# Patient Record
Sex: Male | Born: 1960 | Race: Black or African American | Hispanic: No | Marital: Single | Smoking: Current every day smoker
Health system: Southern US, Community
[De-identification: ages and names within clinical notes are randomized; demographics above are authoritative.]

## PROBLEM LIST (undated history)

## (undated) ENCOUNTER — Ambulatory Visit: Admission: EM | Source: Home / Self Care

## (undated) DIAGNOSIS — R7303 Prediabetes: Secondary | ICD-10-CM

## (undated) DIAGNOSIS — M47816 Spondylosis without myelopathy or radiculopathy, lumbar region: Secondary | ICD-10-CM

## (undated) DIAGNOSIS — F17210 Nicotine dependence, cigarettes, uncomplicated: Secondary | ICD-10-CM

## (undated) DIAGNOSIS — M549 Dorsalgia, unspecified: Secondary | ICD-10-CM

## (undated) DIAGNOSIS — E785 Hyperlipidemia, unspecified: Secondary | ICD-10-CM

## (undated) DIAGNOSIS — F1491 Cocaine use, unspecified, in remission: Secondary | ICD-10-CM

## (undated) DIAGNOSIS — M48061 Spinal stenosis, lumbar region without neurogenic claudication: Secondary | ICD-10-CM

## (undated) DIAGNOSIS — R0600 Dyspnea, unspecified: Secondary | ICD-10-CM

## (undated) HISTORY — PX: HAND SURGERY: SHX662

---

## 2006-07-30 ENCOUNTER — Emergency Department: Payer: Self-pay | Admitting: Emergency Medicine

## 2006-12-27 ENCOUNTER — Emergency Department: Payer: Self-pay | Admitting: Emergency Medicine

## 2007-05-30 ENCOUNTER — Emergency Department: Payer: Self-pay | Admitting: Emergency Medicine

## 2012-01-21 ENCOUNTER — Emergency Department: Payer: Self-pay | Admitting: *Deleted

## 2012-01-21 LAB — ETHANOL: Ethanol %: <0.003 % (ref 0.000–0.080)

## 2012-01-21 LAB — URINALYSIS, COMPLETE
Bacteria: NONE SEEN
Bilirubin,UR: NEGATIVE
Glucose,UR: NEGATIVE mg/dL (ref 0–75)
Ketone: NEGATIVE
Leukocyte Esterase: NEGATIVE
Nitrite: NEGATIVE
Ph: 6 (ref 4.5–8.0)
Protein: NEGATIVE

## 2012-01-21 LAB — CK TOTAL AND CKMB (NOT AT ARMC): CK, Total: 2167 U/L — ABNORMAL HIGH (ref 35–232)

## 2012-01-21 LAB — CBC
HCT: 39.5 % — ABNORMAL LOW (ref 40.0–52.0)
MCV: 84 fL (ref 80–100)
RBC: 4.71 10*6/uL (ref 4.40–5.90)

## 2012-01-21 LAB — COMPREHENSIVE METABOLIC PANEL
Albumin: 3.7 g/dL (ref 3.4–5.0)
Anion Gap: 10 (ref 7–16)
BUN: 17 mg/dL (ref 7–18)
Bilirubin,Total: 0.6 mg/dL (ref 0.2–1.0)
Calcium, Total: 8.5 mg/dL (ref 8.5–10.1)
Chloride: 107 mmol/L (ref 98–107)
Co2: 23 mmol/L (ref 21–32)
Creatinine: 1.07 mg/dL (ref 0.60–1.30)
EGFR (African American): >60
EGFR (Non-African Amer.): >60
Osmolality: 283 (ref 275–301)
Potassium: 3.8 mmol/L (ref 3.5–5.1)
SGOT(AST): 46 U/L — ABNORMAL HIGH (ref 15–37)
Sodium: 140 mmol/L (ref 136–145)

## 2012-01-21 LAB — TROPONIN I: Troponin-I: <0.02 ng/mL

## 2013-06-03 ENCOUNTER — Emergency Department: Payer: Self-pay | Admitting: Emergency Medicine

## 2015-03-25 ENCOUNTER — Emergency Department
Admission: EM | Admit: 2015-03-25 | Discharge: 2015-03-25 | Disposition: A | Discharge status: Home / Self Care | Payer: Self-pay | Attending: Emergency Medicine | Admitting: Emergency Medicine

## 2015-03-25 ENCOUNTER — Encounter: Payer: Self-pay | Admitting: Emergency Medicine

## 2015-03-25 DIAGNOSIS — R21 Rash and other nonspecific skin eruption: Secondary | ICD-10-CM

## 2015-03-25 DIAGNOSIS — L0201 Cutaneous abscess of face: Secondary | ICD-10-CM | POA: Insufficient documentation

## 2015-03-25 DIAGNOSIS — B86 Scabies: Secondary | ICD-10-CM | POA: Insufficient documentation

## 2015-03-25 DIAGNOSIS — Z720 Tobacco use: Secondary | ICD-10-CM | POA: Insufficient documentation

## 2015-03-25 DIAGNOSIS — L0291 Cutaneous abscess, unspecified: Secondary | ICD-10-CM

## 2015-03-25 HISTORY — DX: Dorsalgia, unspecified: M54.9

## 2015-03-25 MED ORDER — PERMETHRIN 5 % EX CREA
TOPICAL_CREAM | Freq: Once | CUTANEOUS | Status: AC
Start: 2015-03-25 — End: 2015-03-25

## 2015-03-25 MED ORDER — HYDROXYZINE HCL 25 MG PO TABS
25.0000 mg | ORAL_TABLET | Freq: Once | ORAL | Status: AC
  Administered 2015-03-25: 25 mg via ORAL

## 2015-03-25 MED ORDER — SULFAMETHOXAZOLE-TRIMETHOPRIM 800-160 MG PO TABS
ORAL_TABLET | ORAL | Status: AC
  Filled 2015-03-25: qty 1

## 2015-03-25 MED ORDER — HYDROXYZINE HCL 25 MG PO TABS: 25.0000 mg | ORAL_TABLET | Freq: Three times a day (TID) | ORAL | Status: DC | PRN

## 2015-03-25 MED ORDER — SULFAMETHOXAZOLE-TRIMETHOPRIM 400-80 MG PO TABS: 1.0000 | ORAL_TABLET | Freq: Two times a day (BID) | ORAL | Status: DC

## 2015-03-25 MED ORDER — HYDROXYZINE HCL 25 MG PO TABS
ORAL_TABLET | ORAL | Status: AC
  Filled 2015-03-25: qty 1

## 2015-03-25 MED ORDER — SULFAMETHOXAZOLE-TRIMETHOPRIM 800-160 MG PO TABS
1.0000 | ORAL_TABLET | Freq: Once | ORAL | Status: AC
  Administered 2015-03-25: 1 via ORAL

## 2015-03-25 NOTE — Discharge Instructions (Signed)
Take medication as prescribed. Keep clean with soap and water. Apply warm compresses 4 times a day as discussed. Wash all clothes and bedding in hot water.   Follow up with your primary care physician or the above as needed.   Return to ER for new or worsening concerns.   Abscess An abscess is an infected area that contains a collection of pus and debris.It can occur in almost any part of the body. An abscess is also known as a furuncle or boil. CAUSES  An abscess occurs when tissue gets infected. This can occur from blockage of oil or sweat glands, infection of hair follicles, or a minor injury to the skin. As the body tries to fight the infection, pus collects in the area and creates pressure under the skin. This pressure causes pain. People with weakened immune systems have difficulty fighting infections and get certain abscesses more often.  SYMPTOMS Usually an abscess develops on the skin and becomes a painful mass that is red, warm, and tender. If the abscess forms under the skin, you may feel a moveable soft area under the skin. Some abscesses break open (rupture) on their own, but most will continue to get worse without care. The infection can spread deeper into the body and eventually into the bloodstream, causing you to feel ill.  DIAGNOSIS  Your caregiver will take your medical history and perform a physical exam. A sample of fluid may also be taken from the abscess to determine what is causing your infection. TREATMENT  Your caregiver may prescribe antibiotic medicines to fight the infection. However, taking antibiotics alone usually does not cure an abscess. Your caregiver may need to make a small cut (incision) in the abscess to drain the pus. In some cases, gauze is packed into the abscess to reduce pain and to continue draining the area. HOME CARE INSTRUCTIONS   Only take over-the-counter or prescription medicines for pain, discomfort, or fever as directed by your caregiver.  If  you were prescribed antibiotics, take them as directed. Finish them even if you start to feel better.  If gauze is used, follow your caregiver's directions for changing the gauze.  To avoid spreading the infection:  Keep your draining abscess covered with a bandage.  Wash your hands well.  Do not share personal care items, towels, or whirlpools with others.  Avoid skin contact with others.  Keep your skin and clothes clean around the abscess.  Keep all follow-up appointments as directed by your caregiver. SEEK MEDICAL CARE IF:   You have increased pain, swelling, redness, fluid drainage, or bleeding.  You have muscle aches, chills, or a general ill feeling.  You have a fever. MAKE SURE YOU:   Understand these instructions.  Will watch your condition.  Will get help right away if you are not doing well or get worse. Document Released: 07/08/2005 Document Revised: 03/29/2012 Document Reviewed: 12/11/2011 [facility] Patient Information 2015 [place], [place]. This information is not intended to replace advice given to you by your health care provider. Make sure you discuss any questions you have with your health care provider.  Scabies Scabies are small bugs (mites) that burrow under the skin and cause red bumps and severe itching. These bugs can only be seen with a microscope. Scabies are highly contagious. They can spread easily from person to person by direct contact. They are also spread through sharing clothing or linens that have the scabies mites living in them. It is not unusual for an entire  family to become infected through shared towels, clothing, or bedding.  HOME CARE INSTRUCTIONS   Your caregiver may prescribe a cream or lotion to kill the mites. If cream is prescribed, massage the cream into the entire body from the neck to the bottom of both feet. Also massage the cream into the scalp and face if your child is less than 39 year old. Avoid the eyes and mouth. Do not wash  your hands after application.  Leave the cream on for 8 to 12 hours. Your child should bathe or shower after the 8 to 12 hour application period. Sometimes it is helpful to apply the cream to your child right before bedtime.  One treatment is usually effective and will eliminate approximately 95% of infestations. For severe cases, your caregiver may decide to repeat the treatment in 1 week. Everyone in your household should be treated with one application of the cream.  New rashes or burrows should not appear within 24 to 48 hours after successful treatment. However, the itching and rash may last for 2 to 4 weeks after successful treatment. Your caregiver may prescribe a medicine to help with the itching or to help the rash go away more quickly.  Scabies can live on clothing or linens for up to 3 days. All of your child's recently used clothing, towels, stuffed toys, and bed linens should be washed in hot water and then dried in a dryer for at least 20 minutes on high heat. Items that cannot be washed should be enclosed in a plastic bag for at least 3 days.  To help relieve itching, bathe your child in a cool bath or apply cool washcloths to the affected areas.  Your child may return to school after treatment with the prescribed cream. SEEK MEDICAL CARE IF:   The itching persists longer than 4 weeks after treatment.  The rash spreads or becomes infected. Signs of infection include red blisters or yellow-tan crust. Document Released: 09/28/2005 Document Revised: 12/21/2011 Document Reviewed: 02/06/2009 [facility] Patient Information 2015 [place], [place]. This information is not intended to replace advice given to you by your health care provider. Make sure you discuss any questions you have with your health care provider.

## 2015-03-25 NOTE — ED Notes (Signed)
Pt has an abscess on his forehead on the left side that started 3-4 days ago . pty state that he put warm compress on it and it oozed pus and blood. Pt also states that he has a rash on both his forearms that itch. He says the rash on the forearm has be there for 2-3 months. Airway intact breathing and circulation within normal limits. No apparent distress. Pt states he feels no pain but has some pressure on the site of the abscess.

## 2015-03-25 NOTE — ED Provider Notes (Signed)
[facility] Emergency Department Provider Note  ____________________________________________  Time seen: Approximately 7:17 AM  I have reviewed the triage vital signs and the nursing notes.   HISTORY  Chief Complaint Abscess   HPI Johnny Gonzales is a 54 y.o. male presents to the ER for complaints of abscess to left forehead. Patient states that he thinks that he was bitten by an insect several days ago and it looked like he has small pimple which gradually progressed. States the area is mildly tender but denies pain. Denies itching or other pain. Denies other abscesses. Patient states it has been draining small amounts intermittently.  Patient also reports that he has had a itchy rash all over for the last 5 or 6 weeks. Patient states it started on his hands and then progressed. Patient states that it itches all over. Patient denies any changes and foods, medications, lotions, detergents or other contacts.  Denies fever, shortness of breath, chest pain, abdominal pain, swelling, facial swelling, change in oral intake, or other complaints.   Past Medical History  Diagnosis Date  . Back pain     There are no active problems to display for this patient.   Past Surgical History  Procedure Laterality Date  . Hand surgery      No current outpatient prescriptions on file.  Allergies Review of patient's allergies indicates no known allergies.  No family history on file.  Social History History  Substance Use Topics  . Smoking status: Current Every Day Smoker  . Smokeless tobacco: Not on file  . Alcohol Use: Yes    Review of Systems Constitutional: No fever/chills Eyes: No visual changes. ENT: No sore throat. Cardiovascular: Denies chest pain. Respiratory: Denies shortness of breath. Gastrointestinal: No abdominal pain.  No nausea, no vomiting.  No diarrhea.  No constipation. Genitourinary: Negative for dysuria. Musculoskeletal: Negative for back  pain. Skin: Positive for rash. Neurological: Negative for headaches, focal weakness or numbness.  10-point ROS otherwise negative.  ____________________________________________   PHYSICAL EXAM:  VITAL SIGNS: ED Triage Vitals  Enc Vitals Group     BP 03/25/15 0647 140/77 mmHg     Pulse Rate 03/25/15 0647 74     Resp 03/25/15 0647 20     Temp 03/25/15 0645 98.2 F (36.8 C)     Temp Source 03/25/15 0645 Oral     SpO2 03/25/15 0647 98 %     Weight 03/25/15 0645 235 lb (106.595 kg)     Height 03/25/15 0645 6' (1.829 m)     Head Cir --      Peak Flow --      Pain Score 03/25/15 0646 5     Pain Loc --      Pain Edu? --      Excl. in GC? --     Constitutional: Alert and oriented. Well appearing and in no acute distress. Eyes: Conjunctivae are normal. PERRL. EOMI. Head: Atraumatic. Left upper forehead with small less than 1 cm superficial abscess with minimal active purulent drainage. No erythema. No surrounding erythema. No induration. Skin otherwise intact. No facial swelling. Nose: No congestion/rhinnorhea. Mouth/Throat: Mucous membranes are moist.  Oropharynx non-erythematous. Neck: No stridor.  No cervical spine tenderness to palpation. Hematological/Lymphatic/Immunilogical: No cervical lymphadenopathy. Cardiovascular: Normal rate, regular rhythm. Grossly normal heart sounds.  Good peripheral circulation. Respiratory: Normal respiratory effort.  No retractions. Lungs CTAB. Gastrointestinal: Soft and nontender. No distention. No abdominal bruits. No CVA tenderness. Musculoskeletal: No lower extremity tenderness nor edema.  No  joint effusions. Neurologic:  Normal speech and language. No gross focal neurologic deficits are appreciated. Speech is normal. No gait instability. Skin:  Skin is warm, dry and intact. Generalized pruritic small erythematous papules with some excoriation, no surrounding erythema, induration or fluctuance. Psychiatric: Mood and affect are normal. Speech  and behavior are normal.  _________________________________________ Procedure: Superficial left forehead abscess cleaned with Betadine. A gentle expression applied and small amount of purulent discharge obtained. Superficial abscess with active drainage. No further indication for incision. Patient tolerated well.  INITIAL IMPRESSION / ASSESSMENT AND PLAN / ED COURSE  Pertinent labs & imaging results that were available during my care of the patient were reviewed by me and considered in my medical decision making (see chart for details).  Well-appearing patient. Presents the ER for the complaint of left forehead abscess. Left forehead superficial abscess small actively draining without surrounding erythema, discuss cleaning as well as warm compresses. Patient also with greater than a month. He crash, as the parents consistent with scabies. Will treat with topical permethrin. Also will treat with when necessary hydroxyzine for itching as well as Bactrim. Discussed follow-up with his primary care physician this week. Discussed follow-up and return parameters. Patient agreed to plan. ____________________________________________   FINAL CLINICAL IMPRESSION(S) / ED DIAGNOSES  Final diagnoses:  Scabies  Rash  Abscess to left forehead      Renford Dills, NP 03/25/15 1610  Phineas Semen, MD 03/25/15 1012

## 2015-03-25 NOTE — ED Notes (Signed)
Pt presents to ER alert and in NAD. Pt has grape sized abscess noted to left forehead.

## 2015-04-28 ENCOUNTER — Inpatient Hospital Stay
Admission: EM | Admit: 2015-04-28 | Discharge: 2015-05-01 | DRG: 603 | Disposition: A | Discharge status: Home / Self Care | Payer: Self-pay | Attending: Internal Medicine | Admitting: Internal Medicine

## 2015-04-28 ENCOUNTER — Emergency Department: Payer: Self-pay

## 2015-04-28 DIAGNOSIS — L03315 Cellulitis of perineum: Secondary | ICD-10-CM

## 2015-04-28 DIAGNOSIS — N4822 Cellulitis of corpus cavernosum and penis: Secondary | ICD-10-CM | POA: Diagnosis present

## 2015-04-28 DIAGNOSIS — L02215 Cutaneous abscess of perineum: Principal | ICD-10-CM | POA: Diagnosis present

## 2015-04-28 DIAGNOSIS — N492 Inflammatory disorders of scrotum: Secondary | ICD-10-CM | POA: Diagnosis present

## 2015-04-28 DIAGNOSIS — B9561 Methicillin susceptible Staphylococcus aureus infection as the cause of diseases classified elsewhere: Secondary | ICD-10-CM | POA: Diagnosis present

## 2015-04-28 DIAGNOSIS — N5089 Other specified disorders of the male genital organs: Secondary | ICD-10-CM

## 2015-04-28 DIAGNOSIS — Z716 Tobacco abuse counseling: Secondary | ICD-10-CM | POA: Diagnosis present

## 2015-04-28 DIAGNOSIS — E876 Hypokalemia: Secondary | ICD-10-CM | POA: Diagnosis present

## 2015-04-28 DIAGNOSIS — L0390 Cellulitis, unspecified: Secondary | ICD-10-CM

## 2015-04-28 DIAGNOSIS — L03314 Cellulitis of groin: Secondary | ICD-10-CM

## 2015-04-28 DIAGNOSIS — F1290 Cannabis use, unspecified, uncomplicated: Secondary | ICD-10-CM | POA: Diagnosis present

## 2015-04-28 DIAGNOSIS — R102 Pelvic and perineal pain: Secondary | ICD-10-CM

## 2015-04-28 DIAGNOSIS — N50819 Testicular pain, unspecified: Secondary | ICD-10-CM

## 2015-04-28 DIAGNOSIS — D72829 Elevated white blood cell count, unspecified: Secondary | ICD-10-CM

## 2015-04-28 DIAGNOSIS — Z9889 Other specified postprocedural states: Secondary | ICD-10-CM

## 2015-04-28 DIAGNOSIS — Z7982 Long term (current) use of aspirin: Secondary | ICD-10-CM

## 2015-04-28 DIAGNOSIS — Z79899 Other long term (current) drug therapy: Secondary | ICD-10-CM

## 2015-04-28 DIAGNOSIS — F17200 Nicotine dependence, unspecified, uncomplicated: Secondary | ICD-10-CM | POA: Diagnosis present

## 2015-04-28 DIAGNOSIS — Z6832 Body mass index (BMI) 32.0-32.9, adult: Secondary | ICD-10-CM

## 2015-04-28 DIAGNOSIS — R319 Hematuria, unspecified: Secondary | ICD-10-CM | POA: Diagnosis present

## 2015-04-28 LAB — URINALYSIS COMPLETE WITH MICROSCOPIC (ARMC ONLY)
BACTERIA UA: NONE SEEN
Bilirubin Urine: NEGATIVE
GLUCOSE, UA: NEGATIVE mg/dL
LEUKOCYTES UA: NEGATIVE
Nitrite: NEGATIVE
Protein, ur: 100 mg/dL — AB
Specific Gravity, Urine: 1.025 (ref 1.005–1.030)
pH: 5 (ref 5.0–8.0)

## 2015-04-28 LAB — COMPREHENSIVE METABOLIC PANEL
ALT: 14 U/L — AB (ref 17–63)
AST: 15 U/L (ref 15–41)
Albumin: 3.1 g/dL — ABNORMAL LOW (ref 3.5–5.0)
Alkaline Phosphatase: 54 U/L (ref 38–126)
Anion gap: 9 (ref 5–15)
BUN: 13 mg/dL (ref 6–20)
CALCIUM: 7.4 mg/dL — AB (ref 8.9–10.3)
CO2: 19 mmol/L — AB (ref 22–32)
Chloride: 109 mmol/L (ref 101–111)
Creatinine, Ser: 0.91 mg/dL (ref 0.61–1.24)
GLUCOSE: 105 mg/dL — AB (ref 65–99)
Potassium: 3 mmol/L — ABNORMAL LOW (ref 3.5–5.1)
Sodium: 137 mmol/L (ref 135–145)
Total Bilirubin: 0.7 mg/dL (ref 0.3–1.2)
Total Protein: 6.4 g/dL — ABNORMAL LOW (ref 6.5–8.1)

## 2015-04-28 LAB — CBC WITH DIFFERENTIAL/PLATELET
BASOS ABS: 0.1 10*3/uL (ref 0–0.1)
BASOS PCT: 0 %
Eosinophils Absolute: 0.2 10*3/uL (ref 0–0.7)
Eosinophils Relative: 1 %
HCT: 39.3 % — ABNORMAL LOW (ref 40.0–52.0)
HEMOGLOBIN: 12.9 g/dL — AB (ref 13.0–18.0)
LYMPHS PCT: 6 %
Lymphs Abs: 1.3 10*3/uL (ref 1.0–3.6)
MCH: 27 pg (ref 26.0–34.0)
MCHC: 32.8 g/dL (ref 32.0–36.0)
MCV: 82.3 fL (ref 80.0–100.0)
Monocytes Absolute: 1.4 10*3/uL — ABNORMAL HIGH (ref 0.2–1.0)
Monocytes Relative: 6 %
NEUTROS PCT: 87 %
Neutro Abs: 18.7 10*3/uL — ABNORMAL HIGH (ref 1.4–6.5)
Platelets: 220 10*3/uL (ref 150–440)
RBC: 4.77 MIL/uL (ref 4.40–5.90)
RDW: 13.4 % (ref 11.5–14.5)
WBC: 21.7 10*3/uL — AB (ref 3.8–10.6)

## 2015-04-28 MED ORDER — AMPICILLIN-SULBACTAM SODIUM 3 (2-1) G IJ SOLR
3.0000 g | Freq: Four times a day (QID) | INTRAMUSCULAR | Status: DC
  Administered 2015-04-28 – 2015-05-01 (×11): 3 g via INTRAVENOUS
  Filled 2015-04-28 (×17): qty 3

## 2015-04-28 MED ORDER — HYDROCODONE-ACETAMINOPHEN 5-325 MG PO TABS
1.0000 | ORAL_TABLET | ORAL | Status: DC | PRN
  Administered 2015-04-28 – 2015-05-01 (×10): 2 via ORAL
  Filled 2015-04-28 (×10): qty 2

## 2015-04-28 MED ORDER — SODIUM CHLORIDE 0.9 % IV SOLN
INTRAVENOUS | Status: DC
  Administered 2015-04-28 – 2015-04-29 (×3): via INTRAVENOUS
  Administered 2015-04-30: 1 mL via INTRAVENOUS
  Administered 2015-05-01: 04:00:00 via INTRAVENOUS

## 2015-04-28 MED ORDER — VANCOMYCIN HCL IN DEXTROSE 1-5 GM/200ML-% IV SOLN
1000.0000 mg | Freq: Three times a day (TID) | INTRAVENOUS | Status: DC
  Administered 2015-04-29 – 2015-05-01 (×8): 1000 mg via INTRAVENOUS
  Filled 2015-04-28 (×12): qty 200

## 2015-04-28 MED ORDER — MORPHINE SULFATE 4 MG/ML IJ SOLN
4.0000 mg | Freq: Once | INTRAMUSCULAR | Status: AC
  Administered 2015-04-28: 4 mg via INTRAVENOUS
  Filled 2015-04-28: qty 1

## 2015-04-28 MED ORDER — ASPIRIN EC 81 MG PO TBEC
81.0000 mg | DELAYED_RELEASE_TABLET | Freq: Every day | ORAL | Status: DC
  Administered 2015-04-29 – 2015-05-01 (×3): 81 mg via ORAL
  Filled 2015-04-28 (×2): qty 1

## 2015-04-28 MED ORDER — ONDANSETRON HCL 4 MG PO TABS: 4.0000 mg | ORAL_TABLET | Freq: Four times a day (QID) | ORAL | Status: DC | PRN

## 2015-04-28 MED ORDER — AMPICILLIN-SULBACTAM SODIUM 3 (2-1) G IJ SOLR
3.0000 g | Freq: Once | INTRAMUSCULAR | Status: AC
  Administered 2015-04-28: 3 g via INTRAVENOUS
  Filled 2015-04-28: qty 3

## 2015-04-28 MED ORDER — IOHEXOL 300 MG/ML  SOLN
125.0000 mL | Freq: Once | INTRAMUSCULAR | Status: AC | PRN
  Administered 2015-04-28: 125 mL via INTRAVENOUS

## 2015-04-28 MED ORDER — MORPHINE SULFATE 2 MG/ML IJ SOLN
2.0000 mg | INTRAMUSCULAR | Status: DC | PRN
  Administered 2015-04-28 – 2015-04-30 (×7): 2 mg via INTRAVENOUS
  Filled 2015-04-28 (×7): qty 1

## 2015-04-28 MED ORDER — HEPARIN SODIUM (PORCINE) 5000 UNIT/ML IJ SOLN
5000.0000 [IU] | Freq: Three times a day (TID) | INTRAMUSCULAR | Status: DC
  Administered 2015-04-28 – 2015-05-01 (×6): 5000 [IU] via SUBCUTANEOUS
  Filled 2015-04-28 (×6): qty 1

## 2015-04-28 MED ORDER — ACETAMINOPHEN 325 MG PO TABS: 650.0000 mg | ORAL_TABLET | Freq: Four times a day (QID) | ORAL | Status: DC | PRN

## 2015-04-28 MED ORDER — ACETAMINOPHEN 650 MG RE SUPP: 650.0000 mg | Freq: Four times a day (QID) | RECTAL | Status: DC | PRN

## 2015-04-28 MED ORDER — IOHEXOL 240 MG/ML SOLN
25.0000 mL | Freq: Once | INTRAMUSCULAR | Status: AC | PRN
  Administered 2015-04-28: 25 mL via ORAL

## 2015-04-28 MED ORDER — PANTOPRAZOLE SODIUM 40 MG PO TBEC
40.0000 mg | DELAYED_RELEASE_TABLET | Freq: Every day | ORAL | Status: DC
  Administered 2015-04-29 – 2015-05-01 (×3): 40 mg via ORAL
  Filled 2015-04-28 (×4): qty 1

## 2015-04-28 MED ORDER — DOCUSATE SODIUM 100 MG PO CAPS
100.0000 mg | ORAL_CAPSULE | Freq: Two times a day (BID) | ORAL | Status: DC
  Administered 2015-04-28 – 2015-05-01 (×6): 100 mg via ORAL
  Filled 2015-04-28 (×6): qty 1

## 2015-04-28 MED ORDER — VANCOMYCIN HCL IN DEXTROSE 1-5 GM/200ML-% IV SOLN
1000.0000 mg | Freq: Once | INTRAVENOUS | Status: AC
  Administered 2015-04-28: 1000 mg via INTRAVENOUS
  Filled 2015-04-28: qty 200

## 2015-04-28 MED ORDER — ONDANSETRON HCL 4 MG/2ML IJ SOLN
4.0000 mg | Freq: Four times a day (QID) | INTRAMUSCULAR | Status: DC | PRN
  Administered 2015-04-30: 4 mg via INTRAVENOUS

## 2015-04-28 MED ORDER — ONDANSETRON HCL 4 MG/2ML IJ SOLN
4.0000 mg | Freq: Once | INTRAMUSCULAR | Status: AC
  Administered 2015-04-28: 4 mg via INTRAVENOUS
  Filled 2015-04-28: qty 2

## 2015-04-28 MED ORDER — BISACODYL 10 MG RE SUPP: 10.0000 mg | Freq: Every day | RECTAL | Status: DC | PRN

## 2015-04-28 NOTE — ED Provider Notes (Signed)
[facility] Emergency Department Provider Note  ____________________________________________  Time seen: Approximately 9:14 AM  I have reviewed the triage vital signs and the nursing notes.   HISTORY  Chief Complaint Groin Swelling    HPI Johnny Gonzales is a 54 y.o. male who reports that Thursday last week he had some itching in his groin and scratched it. Yesterday this area began hurting. He had cold chills all day yesterday and another temperature was in the mid to high 90s. He also has some pain and swelling over his chest and neck. Both of these areas on the right side of the neck and the manubrium R dime size firm swollen and tender there is some oozing bloody material from the chest wall 1. Both of these look like small areas of infection. The groin area is not obviously infected however the penis and scrotum although it back to the anus are swollen and tender firm and warm. They're not red however. Patient denies any coughing nausea vomiting diarrhea or inability to pass urine. In fact patient says his urine is passing normally.   Past Medical History  Diagnosis Date  . Back pain     There are no active problems to display for this patient.   Past Surgical History  Procedure Laterality Date  . Hand surgery      Current Outpatient Rx  Name  Route  Sig  Dispense  Refill  . Aspirin-Salicylamide-Caffeine (BC HEADACHE POWDER PO)   Oral   Take 1 packet by mouth daily as needed (pain and stiffness.).         Marland Kitchen. hydrOXYzine (ATARAX/VISTARIL) 25 MG tablet   Oral   Take 1 tablet (25 mg total) by mouth every 8 (eight) hours as needed for itching.   15 tablet   0   . sulfamethoxazole-trimethoprim (BACTRIM) 400-80 MG per tablet   Oral   Take 1 tablet by mouth 2 (two) times daily.   20 tablet   0     Allergies Review of patient's allergies indicates no known allergies.  History reviewed. No pertinent family history.  Social History History   Substance Use Topics  . Smoking status: Current Every Day Smoker  . Smokeless tobacco: Not on file  . Alcohol Use: Yes    Review of Systems Constitutional: See history of present illness Eyes: No visual changes. ENT: No sore throat. Cardiovascular: Denies chest pain. Respiratory: Denies shortness of breath. Gastrointestinal: No abdominal pain.  No nausea, no vomiting.  No diarrhea.  No constipation. Genitourinary: Negative for dysuria. Musculoskeletal: Negative for back pain other than his mild chronic back pain. Skin: Negative for rash. Neurological: Negative for headaches, focal weakness or numbness.  10-point ROS otherwise negative.  ____________________________________________   PHYSICAL EXAM:  VITAL SIGNS: ED Triage Vitals  Enc Vitals Group     BP 04/28/15 0912 128/83 mmHg     Pulse Rate 04/28/15 0912 83     Resp 04/28/15 0912 16     Temp 04/28/15 0912 98.2 F (36.8 C)     Temp Source 04/28/15 0912 Oral     SpO2 04/28/15 0912 98 %     Weight 04/28/15 0912 240 lb (108.863 kg)     Height 04/28/15 0912 6' (1.829 m)     Head Cir --      Peak Flow --      Pain Score 04/28/15 0913 10     Pain Loc --      Pain Edu? --  Excl. in GC? --     Constitutional: Alert and oriented. Well appearing and in no acute distress. Eyes: Conjunctivae are normal. PERRL. EOMI. Head: Atraumatic. Nose: No congestion/rhinnorhea. Mouth/Throat: Mucous membranes are moist.  Oropharynx non-erythematous. Neck: No stridor. Dime sized firm area in the right side of the neck beard area of the neck as described above. It is not fluctuant  Cardiovascular: Normal rate, regular rhythm. Grossly normal heart sounds.  Good peripheral circulation. Respiratory: Normal respiratory effort.  No retractions. Lungs CTAB. Gastrointestinal: Soft and nontender. No distention. No abdominal bruits. No CVA tenderness. Genitourinary: Penis and scrotum as described in history of present illness R swollen tender  warm but not red they are firm there is no obvious pinpoint site of infection Musculoskeletal: No lower extremity tenderness nor edema.  No joint effusions. Neurologic:  Normal speech and language. No gross focal neurologic deficits are appreciated. No gait instability. Skin:  Skin is warm, dry and intact. No rash noted. Psychiatric: Mood and affect are normal. Speech and behavior are normal.  ____________________________________________   LABS (all labs ordered are listed, but only abnormal results are displayed)  Labs Reviewed  COMPREHENSIVE METABOLIC PANEL - Abnormal; Notable for the following:    Potassium 3.0 (*)    CO2 19 (*)    Glucose, Bld 105 (*)    Calcium 7.4 (*)    Total Protein 6.4 (*)    Albumin 3.1 (*)    ALT 14 (*)    All other components within normal limits  URINALYSIS COMPLETEWITH MICROSCOPIC ([facility] ONLY) - Abnormal; Notable for the following:    Color, Urine YELLOW (*)    APPearance HAZY (*)    Ketones, ur 1+ (*)    Hgb urine dipstick 3+ (*)    Protein, ur 100 (*)    Squamous Epithelial / LPF 0-5 (*)    All other components within normal limits  CBC WITH DIFFERENTIAL/PLATELET - Abnormal; Notable for the following:    WBC 21.7 (*)    Hemoglobin 12.9 (*)    HCT 39.3 (*)    Neutro Abs 18.7 (*)    Monocytes Absolute 1.4 (*)    All other components within normal limits  CULTURE, BLOOD (ROUTINE X 2)  CULTURE, BLOOD (ROUTINE X 2)  CBC WITH DIFFERENTIAL/PLATELET   ____________________________________________  EKG   ____________________________________________  RADIOLOGY no sign of abscess ____________________________________________     ____________________________________________   INITIAL IMPRESSION / ASSESSMENT AND PLAN / ED COURSE  Pertinent labs & imaging results that were available during my care of the patient were reviewed by me and considered in my medical decision making (see chart for details).  Patient with severe cellulitis urology  comes in to see him recommends inpatient antibiotics for a day or 2 he says it appears to be impending 40 days although not for his the present time cellulitis involves the whole penis and scrotum and spreads back to the rectum ____________________________________________   FINAL CLINICAL IMPRESSION(S) / ED DIAGNOSES  Final diagnoses:  Cellulitis of groin      Arnaldo Natal, MD 04/28/15 1324

## 2015-04-28 NOTE — ED Notes (Signed)
Admitting MD at bedside.

## 2015-04-28 NOTE — ED Notes (Signed)
Pt from home via EMS c/o groin swelling and sore on chest since yesterday. Pain 10/10. Pelvic area very edematous.

## 2015-04-28 NOTE — Consult Note (Signed)
04/28/2015 1:18 PM   Johnny Gonzales 1960/10/31 [id number]   CC: Perineal Pain   HPI:  1 - Significant Perineal Cellulitis with Malaise, Leukocytosis, and Refractory Pain- 4 days of increasing perineal pain now severe. Denies h/o skin lesion / rectal trauma / straddle injury. Exam with VERY impressive scrotal-perineal skin thickening / tenderness w/o drainage or fluctuance. No rectal masses or peri-anal fistulae. CT and [place] also with very impressive cellulitis changes but no focal fluid collections or gas to suggests frank abscess or Fournier's. Non-diabetic. No h/o immune compromise.   PMH sig for hand surgery. Denies CV disease but admits to very rare medical contact. No PCP. Not up to date on routine screenings.   Today "Johnny Gonzales" is seen as urgent consult for above, specifically to rule out Fournier's or surgical abscess.   PMH: Past Medical History  Diagnosis Date  . Back pain     Surgical History: Past Surgical History  Procedure Laterality Date  . Hand surgery      Home Medications:    Medication List    ASK your doctor about these medications        BC HEADACHE POWDER PO  Take 1 packet by mouth daily as needed (pain and stiffness.).     hydrOXYzine 25 MG tablet  Commonly known as:  ATARAX/VISTARIL  Take 1 tablet (25 mg total) by mouth every 8 (eight) hours as needed for itching.     sulfamethoxazole-trimethoprim 400-80 MG per tablet  Commonly known as:  BACTRIM  Take 1 tablet by mouth 2 (two) times daily.        Allergies: No Known Allergies  Family History: History reviewed. No pertinent family history.  Social History:  reports that he has been smoking.  He does not have any smokeless tobacco history on file. He reports that he drinks alcohol. He reports that he uses illicit drugs (Marijuana).   Review of Systems     Physical Exam: BP 122/80 mmHg  Pulse 81  Temp(Src) 98.2 F (36.8 C) (Oral)  Resp 16  Ht 6' (1.829 m)  Wt 240 lb (108.863 kg)   BMI 32.54 kg/m2  SpO2 99%  Constitutional:  Alert and oriented, No acute distress. HEENT: [place] AT, moist mucus membranes.  Trachea midline, no masses. Cardiovascular: No clubbing, cyanosis, or edema. Respiratory: Normal respiratory effort, no increased work of breathing. GI: Abdomen is soft, nontender, nondistended, no abdominal masses GU: No CVA tenderness. Impressive inferior scrotal and perineal induration and tenderness w/o fluctuance or crepitus. DRE w/o rectal masses or palpable fistulae.  Skin: No rashes, bruises or suspicious lesions. Lymph: No cervical or inguinal adenopathy. Neurologic: Grossly intact, no focal deficits, moving all 4 extremities. Psychiatric: Normal mood and affect.  Laboratory Data: Lab Results  Component Value Date   WBC 21.7* 04/28/2015   HGB 12.9* 04/28/2015   HCT 39.3* 04/28/2015   MCV 82.3 04/28/2015   PLT 220 04/28/2015    Lab Results  Component Value Date   CREATININE 0.91 04/28/2015    Urinalysis    Component Value Date/Time   COLORURINE YELLOW* 04/28/2015 0921   APPEARANCEUR HAZY* 04/28/2015 0921   LABSPEC 1.025 04/28/2015 0921   PHURINE 5.0 04/28/2015 0921   GLUCOSEU NEGATIVE 04/28/2015 0921   HGBUR 3+* 04/28/2015 0921   BILIRUBINUR NEGATIVE 04/28/2015 0921   KETONESUR 1+* 04/28/2015 0921   PROTEINUR 100* 04/28/2015 0921   NITRITE NEGATIVE 04/28/2015 0921   LEUKOCYTESUR NEGATIVE 04/28/2015 0921    Pertinent Imaging: CT and [place] as per  HPI  Assessment & Plan:    1 - Significant Perineal Cellulitis with Malaise, Leukocytosis, and Refractory Pain- impressive case, fortunately not frank Fournier's or abscess requiring surgical drainage or debridement. He is certainly at substantial risk of developing such  infection. Rec inpatient management with  IV ABX and serial exams / labs to confirm clinical improvement before conversion to PO ABX / outpatient care. Discussed with ER team and medical team who have kindly agreed to admit and  co-manage.   Will follow.   Johnny Gonzales, Johnny Hanover, MD  [facility] [address], [address] [place], [place] [address] [phone]

## 2015-04-28 NOTE — H&P (Signed)
History and Physical    Johnny Gonzales MVH:[id number] DOB: 07-27-1961 DOA: 04/28/2015  Referring physician: Dr. Darnelle Catalan PCP: No PCP Per Patient  Specialists: none  Chief Complaint: Scrotal swelling/pain  HPI: Johnny Gonzales is a 54 y.o. male with no significant PMH who presents with 3-4 week hx of progressive scrotal edema and pain with rash. Also has 2 pustules on chest. WBC 21K in ER with severe scrotal cellulitis noted. No abscess. He is now admitted for IV ABX and further evaluation. Denies hx of DM.  Review of Systems: The patient denies anorexia, fever, weight loss,, vision loss, decreased hearing, hoarseness, chest pain, syncope, dyspnea on exertion, peripheral edema, balance deficits, hemoptysis, abdominal pain, melena, hematochezia, severe indigestion/heartburn, hematuria, incontinence, genital sores, muscle weakness, suspicious skin lesions, transient blindness, difficulty walking, depression, unusual weight change, abnormal bleeding, enlarged lymph nodes, angioedema, and breast masses.   Past Medical History  Diagnosis Date  . Back pain    Past Surgical History  Procedure Laterality Date  . Hand surgery     Social History:  reports that he has been smoking.  He does not have any smokeless tobacco history on file. He reports that he drinks alcohol. He reports that he uses illicit drugs (Marijuana).  No Known Allergies  History reviewed. No pertinent family history.  Prior to Admission medications   Medication Sig Start Date End Date Taking? Authorizing Provider  Aspirin-Salicylamide-Caffeine (BC HEADACHE POWDER PO) Take 1 packet by mouth daily as needed (pain and stiffness.).   Yes Historical Provider, MD  hydrOXYzine (ATARAX/VISTARIL) 25 MG tablet Take 1 tablet (25 mg total) by mouth every 8 (eight) hours as needed for itching. 03/25/15   Renford Dills, NP  sulfamethoxazole-trimethoprim (BACTRIM) 400-80 MG per tablet Take 1 tablet by mouth 2 (two) times daily. 03/25/15    Renford Dills, NP   Physical Exam: Filed Vitals:   04/28/15 1000 04/28/15 1030 04/28/15 1200 04/28/15 1300  BP: 118/75 115/72 114/67 122/80  Pulse: 88 84 76 81  Temp:      TempSrc:      Resp:      Height:      Weight:      SpO2: 96% 96% 99% 99%     General:  No apparent distress  Eyes: PERRL, EOMI, no scleral icterus  ENT: moist oropharynx  Neck: supple, no lymphadenopathy  Cardiovascular: regular rate without MRG; 2+ peripheral pulses, no JVD, no peripheral edema  Respiratory: CTA biL, good air movement without wheezing, rhonchi or crackled  Abdomen: soft, non tender to palpation, positive bowel sounds, no guarding, no rebound  Skin: no rashes  Musculoskeletal: normal bulk and tone, no joint swelling  Psychiatric: normal mood and affect  Neurologic: CN 2-12 grossly intact, MS 5/5 in all 4  GU: severe swelling of entire scrotum with erythema, warmth, and tenderness  Skin: pustular lesions to chest and neck noted.  Labs on Admission:  Basic Metabolic Panel:  Recent Labs Lab 04/28/15 0921  NA 137  K 3.0*  CL 109  CO2 19*  GLUCOSE 105*  BUN 13  CREATININE 0.91  CALCIUM 7.4*   Liver Function Tests:  Recent Labs Lab 04/28/15 0921  AST 15  ALT 14*  ALKPHOS 54  BILITOT 0.7  PROT 6.4*  ALBUMIN 3.1*   No results for input(s): LIPASE, AMYLASE in the last 168 hours. No results for input(s): AMMONIA in the last 168 hours. CBC:  Recent Labs Lab 04/28/15 0937  WBC 21.7*  NEUTROABS 18.7*  HGB 12.9*  HCT 39.3*  MCV 82.3  PLT 220   Cardiac Enzymes: No results for input(s): CKTOTAL, CKMB, CKMBINDEX, TROPONINI in the last 168 hours.  BNP (last 3 results) No results for input(s): BNP in the last 8760 hours.  ProBNP (last 3 results) No results for input(s): PROBNP in the last 8760 hours.  CBG: No results for input(s): GLUCAP in the last 168 hours.  Radiological Exams on Admission: [place] Scrotum  04/28/2015   CLINICAL DATA:  Bilateral  testicular pain/swelling x2 days  EXAM: SCROTAL ULTRASOUND  DOPPLER ULTRASOUND OF THE TESTICLES  TECHNIQUE: Complete ultrasound examination of the testicles, epididymis, and other scrotal structures was performed. Color and spectral Doppler ultrasound were also utilized to evaluate blood flow to the testicles.  COMPARISON:  None.  FINDINGS: Right testicle  Measurements: 4.5 x 2.4 x 2.3 cm. No mass or microlithiasis visualized.  Left testicle  Measurements: 4.3 x 2.2 x 2.7 cm. No mass or microlithiasis visualized.  Right epididymis:  2 x 3 x 3 mm epididymal head cyst.  Left epididymis: Multiple small epididymal cysts measuring up to 3 x 3 x 4 mm.  Hydrocele:  Small bilateral hydroceles.  Varicocele:  None visualized.  Pulsed Doppler interrogation of both testes demonstrates normal low resistance arterial and venous waveforms bilaterally.  IMPRESSION: Normal sonographic appearance of the bilateral testes.  No evidence of testicular torsion.  Small bilateral hydroceles.   Electronically Signed   By: Charline BillsSriyesh  Krishnan M.D.   On: 04/28/2015 11:22   [place] Art/ven Flow Abd Pelv Doppler  04/28/2015   CLINICAL DATA:  Bilateral testicular pain/swelling x2 days  EXAM: SCROTAL ULTRASOUND  DOPPLER ULTRASOUND OF THE TESTICLES  TECHNIQUE: Complete ultrasound examination of the testicles, epididymis, and other scrotal structures was performed. Color and spectral Doppler ultrasound were also utilized to evaluate blood flow to the testicles.  COMPARISON:  None.  FINDINGS: Right testicle  Measurements: 4.5 x 2.4 x 2.3 cm. No mass or microlithiasis visualized.  Left testicle  Measurements: 4.3 x 2.2 x 2.7 cm. No mass or microlithiasis visualized.  Right epididymis:  2 x 3 x 3 mm epididymal head cyst.  Left epididymis: Multiple small epididymal cysts measuring up to 3 x 3 x 4 mm.  Hydrocele:  Small bilateral hydroceles.  Varicocele:  None visualized.  Pulsed Doppler interrogation of both testes demonstrates normal low resistance  arterial and venous waveforms bilaterally.  IMPRESSION: Normal sonographic appearance of the bilateral testes.  No evidence of testicular torsion.  Small bilateral hydroceles.   Electronically Signed   By: Charline BillsSriyesh  Krishnan M.D.   On: 04/28/2015 11:22    EKG: Independently reviewed.  Assessment/Plan Principal Problem:   Cellulitis Active Problems:   Scrotal edema   Will admit to floor with IV ABX. Cultures sent. Consult Urology. Follow sugars. Repeat routine labs in AM. IV morphine for pain control.  Diet: low salt Fluids: NS@100  DVT Prophylaxis: SQ Heparin  Code Status: FULL  Family Communication: none  Disposition Plan: home  Time spent: 50 min

## 2015-04-28 NOTE — Progress Notes (Signed)
ANTIBIOTIC CONSULT NOTE - INITIAL  Pharmacy Consult for Vancomycin Dosing Indication: Cellulitis  No Known Allergies  Patient Measurements: Height: 6' (182.9 cm) Weight: 240 lb (108.863 kg) IBW/kg (Calculated) : 77.6 Adjusted Body Weight: 89.8kg.   Vital Signs: Temp: 98.2 F (36.8 C) (07/17 0912) Temp Source: Oral (07/17 0912) BP: 138/79 mmHg (07/17 1430) Pulse Rate: 81 (07/17 1300) Intake/Output from previous day:   Intake/Output from this shift:    Labs:  Recent Labs  04/28/15 0921 04/28/15 0937  WBC  --  21.7*  HGB  --  12.9*  PLT  --  220  CREATININE 0.91  --    Estimated Creatinine Clearance: 119.6 mL/min (by C-G formula based on Cr of 0.91). No results for input(s): VANCOTROUGH, VANCOPEAK, VANCORANDOM, GENTTROUGH, GENTPEAK, GENTRANDOM, TOBRATROUGH, TOBRAPEAK, TOBRARND, AMIKACINPEAK, AMIKACINTROU, AMIKACIN in the last 72 hours.   Microbiology: No results found for this or any previous visit (from the past 720 hour(s)).  Medical History: Past Medical History  Diagnosis Date  . Back pain     Medications:  Scheduled:  . ampicillin-sulbactam (UNASYN) IV  3 g Intravenous Q6H  . aspirin EC  81 mg Oral Daily  . docusate sodium  100 mg Oral BID  . heparin  5,000 Units Subcutaneous 3 times per day  . pantoprazole  40 mg Oral Daily  . vancomycin  1,000 mg Intravenous Q8H   Infusions:  . sodium chloride 100 mL/hr at 04/28/15 1346   Assessment: Pharmacy consulted to dose vancomycin for 54 yo male admitted with cellulitus/scrotal edema. Patient also initiated on Unasyn 3g IV Q6hr.    Goal of Therapy:  Vancomycin trough level 10-15 mcg/ml  Plan:  Will continue patient on Vancomycin 1g IV Q8hr for goal trough of 10-15. Will obtain trough prior to 5th dose.    Pharmacy will continue to monitor and adjust per consult.    Parmvir Boomer L 04/28/2015,3:15 PM

## 2015-04-28 NOTE — ED Notes (Signed)
Patient transported to CT 

## 2015-04-28 NOTE — ED Notes (Signed)
MD at bedside. 

## 2015-04-29 LAB — COMPREHENSIVE METABOLIC PANEL
ALT: 13 U/L — ABNORMAL LOW (ref 17–63)
AST: 13 U/L — AB (ref 15–41)
Albumin: 3 g/dL — ABNORMAL LOW (ref 3.5–5.0)
Alkaline Phosphatase: 55 U/L (ref 38–126)
Anion gap: 7 (ref 5–15)
BILIRUBIN TOTAL: 0.4 mg/dL (ref 0.3–1.2)
BUN: 8 mg/dL (ref 6–20)
CHLORIDE: 105 mmol/L (ref 101–111)
CO2: 24 mmol/L (ref 22–32)
CREATININE: 1.18 mg/dL (ref 0.61–1.24)
Calcium: 8.1 mg/dL — ABNORMAL LOW (ref 8.9–10.3)
GFR calc Af Amer: >60 mL/min (ref >60)
Glucose, Bld: 128 mg/dL — ABNORMAL HIGH (ref 65–99)
Potassium: 3.4 mmol/L — ABNORMAL LOW (ref 3.5–5.1)
SODIUM: 136 mmol/L (ref 135–145)
Total Protein: 6.6 g/dL (ref 6.5–8.1)

## 2015-04-29 LAB — URINALYSIS COMPLETE WITH MICROSCOPIC (ARMC ONLY)
Bacteria, UA: NONE SEEN
Bilirubin Urine: NEGATIVE
Glucose, UA: NEGATIVE mg/dL
Ketones, ur: NEGATIVE mg/dL
LEUKOCYTES UA: NEGATIVE
Nitrite: NEGATIVE
PROTEIN: 30 mg/dL — AB
SPECIFIC GRAVITY, URINE: 1.02 (ref 1.005–1.030)
pH: 6 (ref 5.0–8.0)

## 2015-04-29 LAB — MAGNESIUM: MAGNESIUM: 2.2 mg/dL (ref 1.7–2.4)

## 2015-04-29 LAB — GLUCOSE, CAPILLARY: Glucose-Capillary: 110 mg/dL — ABNORMAL HIGH (ref 65–99)

## 2015-04-29 LAB — CBC
HEMATOCRIT: 36.5 % — AB (ref 40.0–52.0)
Hemoglobin: 11.9 g/dL — ABNORMAL LOW (ref 13.0–18.0)
MCH: 26.8 pg (ref 26.0–34.0)
MCHC: 32.7 g/dL (ref 32.0–36.0)
MCV: 81.9 fL (ref 80.0–100.0)
Platelets: 216 10*3/uL (ref 150–440)
RBC: 4.45 MIL/uL (ref 4.40–5.90)
RDW: 13.1 % (ref 11.5–14.5)
WBC: 20.6 10*3/uL — AB (ref 3.8–10.6)

## 2015-04-29 MED ORDER — NICOTINE 14 MG/24HR TD PT24
14.0000 mg | MEDICATED_PATCH | Freq: Every day | TRANSDERMAL | Status: DC
  Administered 2015-04-29 – 2015-04-30 (×2): 14 mg via TRANSDERMAL
  Filled 2015-04-29 (×3): qty 1

## 2015-04-29 MED ORDER — POTASSIUM CHLORIDE CRYS ER 20 MEQ PO TBCR
40.0000 meq | EXTENDED_RELEASE_TABLET | Freq: Once | ORAL | Status: AC
  Administered 2015-04-29: 40 meq via ORAL
  Filled 2015-04-29: qty 2

## 2015-04-29 NOTE — Progress Notes (Signed)
[facility] [place] [facility]   PATIENT NAME: Johnny Gonzales    MR#:  [id number]  DATE OF BIRTH:  04-19-1961  SUBJECTIVE:  CHIEF COMPLAINT:   Chief Complaint  Patient presents with  . Groin Swelling   scrotal tenderness and swelling.  REVIEW OF SYSTEMS:  CONSTITUTIONAL: No fever, fatigue or weakness.  EYES: No blurred or double vision.  EARS, NOSE, AND THROAT: No tinnitus or ear pain.  RESPIRATORY: No cough, shortness of breath, wheezing or hemoptysis.  CARDIOVASCULAR: No chest pain, orthopnea, edema.  GASTROINTESTINAL: No nausea, vomiting, diarrhea or abdominal pain.  GENITOURINARY: No dysuria, hematuria.  scrotal tenderness and swelling. ENDOCRINE: No polyuria, nocturia,  HEMATOLOGY: No anemia, easy bruising or bleeding SKIN: No rash or lesion. MUSCULOSKELETAL: No joint pain or arthritis.   NEUROLOGIC: No tingling, numbness, weakness.  PSYCHIATRY: No anxiety or depression.   DRUG ALLERGIES:  No Known Allergies  VITALS:  Blood pressure 109/65, pulse 79, temperature 97.8 F (36.6 C), temperature source Oral, resp. rate 17, height 6' (1.829 m), weight 99.292 kg (218 lb 14.4 oz), SpO2 100 %.  PHYSICAL EXAMINATION:  GENERAL:  54 y.o.-year-old patient lying in the bed with no acute distress.  EYES: Pupils equal, round, reactive to light and accommodation. No scleral icterus. Extraocular muscles intact.  HEENT: Head atraumatic, normocephalic. Oropharynx and nasopharynx clear.  NECK:  Supple, no jugular venous distention. No thyroid enlargement, no tenderness.  LUNGS: Normal breath sounds bilaterally, no wheezing, rales,rhonchi or crepitation. No use of accessory muscles of respiration.  CARDIOVASCULAR: S1, S2 normal. No murmurs, rubs, or gallops.  ABDOMEN: Soft, nontender, nondistended. Bowel sounds present. No organomegaly or mass.  Scrotal tenderness and swelling. Tenderness on the left side of the scrotum and left groin. EXTREMITIES: No pedal  edema, cyanosis, or clubbing.  NEUROLOGIC: Cranial nerves II through XII are intact. Muscle strength 5/5 in all extremities. Sensation intact. Gait not checked.  PSYCHIATRIC: The patient is alert and oriented x 3.  SKIN: No obvious rash, lesion, or ulcer.    LABORATORY PANEL:   CBC  Recent Labs Lab 04/29/15 0601  WBC 20.6*  HGB 11.9*  HCT 36.5*  PLT 216   ------------------------------------------------------------------------------------------------------------------  Chemistries   Recent Labs Lab 04/29/15 0601  NA 136  K 3.4*  CL 105  CO2 24  GLUCOSE 128*  BUN 8  CREATININE 1.18  CALCIUM 8.1*  MG 2.2  AST 13*  ALT 13*  ALKPHOS 55  BILITOT 0.4   ------------------------------------------------------------------------------------------------------------------  Cardiac Enzymes No results for input(s): TROPONINI in the last 168 hours. ------------------------------------------------------------------------------------------------------------------  RADIOLOGY:  Ct Abdomen Pelvis W Wo Contrast  04/28/2015   CLINICAL DATA:  Groin swelling and soreness.  Evaluate for gangrene.  EXAM: CT ABDOMEN AND PELVIS WITHOUT AND WITH CONTRAST  TECHNIQUE: Multidetector CT imaging of the abdomen and pelvis was performed following the standard protocol before and following the bolus administration of intravenous contrast.  CONTRAST:  25mL OMNIPAQUE IOHEXOL 240 MG/ML SOLN, OMNIPAQUE IOHEXOL 300 MG/ML SOLN  COMPARISON:  01/21/2012 and ultrasound of the scrotum dated 04/28/2015  FINDINGS: Lower chest:  No pleural effusion.  The lung bases appear clear  Hepatobiliary: Calcified granulomas are identified within the liver. The gallbladder appears within normal limits. No biliary dilatation.  Pancreas: Normal appearance of the pancreas.  Spleen: Normal appearance of the spleen.  Adrenals/Urinary Tract: The right adrenal gland is normal. There is an adenoma within the left adrenal gland  measuring 1.3 cm, image 22/series 2. No nephrolithiasis  or hydronephrosis. The urinary bladder appears normal.  Stomach/Bowel: The stomach is within normal limits. The small bowel loops have a normal course and caliber. No obstruction. Normal appearance of the colon. The appendix is visualized and appears normal.  Vascular/Lymphatic: The abdominal aorta appears normal. There is no upper abdominal adenopathy. 9 mm left external iliac lymph node identified. Several prominent inguinal nodes are noted without adenopathy.  Reproductive: Prostate gland and seminal vesicles are unremarkable. Small bilateral hydroceles noted. There is diffuse skin thickening and subcutaneous fat stranding involving the scrotum and perineum. No gas identified within the scrotum. No fluid collections identified.  Other: No free fluid or fluid collections identified.  Musculoskeletal: The visualized osseous structures are negative for aggressive lytic or sclerotic bone lesion. There is degenerative disc disease identified involving the lumbar spine.  IMPRESSION: 1. Scrotal cellulitis.  No evidence for abscess or soft tissue gas. 2. Small bilateral hydroceles. 3. Left adrenal gland adenoma. 4. Prior granulomatous disease.   Electronically Signed   By: Signa Kellaylor  Stroud M.D.   On: 04/28/2015 13:28   [place] Scrotum  04/28/2015   CLINICAL DATA:  Bilateral testicular pain/swelling x2 days  EXAM: SCROTAL ULTRASOUND  DOPPLER ULTRASOUND OF THE TESTICLES  TECHNIQUE: Complete ultrasound examination of the testicles, epididymis, and other scrotal structures was performed. Color and spectral Doppler ultrasound were also utilized to evaluate blood flow to the testicles.  COMPARISON:  None.  FINDINGS: Right testicle  Measurements: 4.5 x 2.4 x 2.3 cm. No mass or microlithiasis visualized.  Left testicle  Measurements: 4.3 x 2.2 x 2.7 cm. No mass or microlithiasis visualized.  Right epididymis:  2 x 3 x 3 mm epididymal head cyst.  Left epididymis: Multiple small  epididymal cysts measuring up to 3 x 3 x 4 mm.  Hydrocele:  Small bilateral hydroceles.  Varicocele:  None visualized.  Pulsed Doppler interrogation of both testes demonstrates normal low resistance arterial and venous waveforms bilaterally.  IMPRESSION: Normal sonographic appearance of the bilateral testes.  No evidence of testicular torsion.  Small bilateral hydroceles.   Electronically Signed   By: Charline BillsSriyesh  Krishnan M.D.   On: 04/28/2015 11:22   [place] Art/ven Flow Abd Pelv Doppler  04/28/2015   CLINICAL DATA:  Bilateral testicular pain/swelling x2 days  EXAM: SCROTAL ULTRASOUND  DOPPLER ULTRASOUND OF THE TESTICLES  TECHNIQUE: Complete ultrasound examination of the testicles, epididymis, and other scrotal structures was performed. Color and spectral Doppler ultrasound were also utilized to evaluate blood flow to the testicles.  COMPARISON:  None.  FINDINGS: Right testicle  Measurements: 4.5 x 2.4 x 2.3 cm. No mass or microlithiasis visualized.  Left testicle  Measurements: 4.3 x 2.2 x 2.7 cm. No mass or microlithiasis visualized.  Right epididymis:  2 x 3 x 3 mm epididymal head cyst.  Left epididymis: Multiple small epididymal cysts measuring up to 3 x 3 x 4 mm.  Hydrocele:  Small bilateral hydroceles.  Varicocele:  None visualized.  Pulsed Doppler interrogation of both testes demonstrates normal low resistance arterial and venous waveforms bilaterally.  IMPRESSION: Normal sonographic appearance of the bilateral testes.  No evidence of testicular torsion.  Small bilateral hydroceles.   Electronically Signed   By: Charline BillsSriyesh  Krishnan M.D.   On: 04/28/2015 11:22    EKG:  No orders found for this or any previous visit.  ASSESSMENT AND PLAN:   Scrotal cellulitis with leukocytosis. Continue vancomycin and Unasyn. Follow-up blood culture and urologist. CT of abdomen and pelvis and ultrasound didn't show any abscess.  Hematuria. Urinalysis show hematuria, I will repeat urinalysis today.  Hypokalemia. Give  potassium supplement.  Morbid obesity  Tobacco abuse. Smoking cessation was counseled for 3-4 minutes and give nicotine patch.  Discussed with on-call  urologist. All the records are reviewed and case discussed with Care Management/Social Workerr. Management plans discussed with the patient, his brother and they are in agreement.  CODE STATUS: Full code  TOTAL TIME TAKING CARE OF THIS PATIENT: 38 minutes.   POSSIBLE D/C IN 3 DAYS, DEPENDING ON CLINICAL CONDITION.   Shaune Pollack M.D on 04/29/2015 at 1:52 PM  Between 7am to 6pm - Pager - [phone]  After 6pm go to [email or web address] - password [email or web address] [facility]  [facility]  Office  [phone]  CC: Primary care physician; No PCP Per Patient

## 2015-04-29 NOTE — Care Management (Signed)
Patient seen today. Afebrile. White count still elevated and on 2 antibiotics including vancomycin. The the abscess in the perineal area is becoming fluctuant but is not attempting to necrosis of the skin. I feel it needs to be incised and drained tomorrow with cultures taken. Patient still is having discomfort. I have discussed with him incising draining this abscess. I will also discuss this with his brother at his request. Patient will be nothing by mouth midnight in the morning we will incise and drain since perineal abscess. In any he is not in any danger of a foreign years gangrene or rapidly spreading abscess. It feels contain. Does not feel to come from the rectum but is in the rectal area.

## 2015-04-30 ENCOUNTER — Inpatient Hospital Stay: Payer: Self-pay | Admitting: Certified Registered Nurse Anesthetist

## 2015-04-30 ENCOUNTER — Inpatient Hospital Stay: Payer: MEDICAID | Admitting: Certified Registered Nurse Anesthetist

## 2015-04-30 ENCOUNTER — Encounter
Admission: EM | Disposition: A | Discharge status: Home / Self Care | Payer: Self-pay | Source: Home / Self Care | Attending: Internal Medicine

## 2015-04-30 ENCOUNTER — Ambulatory Visit: Admit: 2015-04-30 | Payer: Self-pay | Admitting: Urology

## 2015-04-30 ENCOUNTER — Encounter: Payer: Self-pay | Admitting: Anesthesiology

## 2015-04-30 DIAGNOSIS — N492 Inflammatory disorders of scrotum: Secondary | ICD-10-CM | POA: Insufficient documentation

## 2015-04-30 DIAGNOSIS — L02215 Cutaneous abscess of perineum: Secondary | ICD-10-CM

## 2015-04-30 HISTORY — PX: INCISION AND DRAINAGE ABSCESS: SHX5864

## 2015-04-30 LAB — BASIC METABOLIC PANEL
Anion gap: 8 (ref 5–15)
BUN: 10 mg/dL (ref 6–20)
CALCIUM: 8 mg/dL — AB (ref 8.9–10.3)
CO2: 21 mmol/L — AB (ref 22–32)
Chloride: 110 mmol/L (ref 101–111)
Creatinine, Ser: 1.17 mg/dL (ref 0.61–1.24)
GFR calc Af Amer: >60 mL/min (ref >60)
GFR calc non Af Amer: >60 mL/min (ref >60)
Glucose, Bld: 125 mg/dL — ABNORMAL HIGH (ref 65–99)
Potassium: 3.7 mmol/L (ref 3.5–5.1)
SODIUM: 139 mmol/L (ref 135–145)

## 2015-04-30 LAB — CBC
HEMATOCRIT: 33.7 % — AB (ref 40.0–52.0)
Hemoglobin: 11.2 g/dL — ABNORMAL LOW (ref 13.0–18.0)
MCH: 27.4 pg (ref 26.0–34.0)
MCHC: 33.1 g/dL (ref 32.0–36.0)
MCV: 82.7 fL (ref 80.0–100.0)
PLATELETS: 231 10*3/uL (ref 150–440)
RBC: 4.08 MIL/uL — AB (ref 4.40–5.90)
RDW: 13.4 % (ref 11.5–14.5)
WBC: 15.6 10*3/uL — ABNORMAL HIGH (ref 3.8–10.6)

## 2015-04-30 LAB — GLUCOSE, CAPILLARY: Glucose-Capillary: 109 mg/dL — ABNORMAL HIGH (ref 65–99)

## 2015-04-30 LAB — VANCOMYCIN, TROUGH: Vancomycin Tr: 12 ug/mL (ref 10–20)

## 2015-04-30 SURGERY — INCISION AND DRAINAGE, ABSCESS
Anesthesia: General | Site: Scrotum | Wound class: Dirty or Infected

## 2015-04-30 MED ORDER — PROPOFOL 10 MG/ML IV BOLUS
INTRAVENOUS | Status: DC | PRN
  Administered 2015-04-30: 200 mg via INTRAVENOUS
  Administered 2015-04-30: 80 mg via INTRAVENOUS

## 2015-04-30 MED ORDER — FENTANYL CITRATE (PF) 100 MCG/2ML IJ SOLN
INTRAMUSCULAR | Status: AC
  Filled 2015-04-30: qty 2

## 2015-04-30 MED ORDER — DIPHENHYDRAMINE HCL 25 MG PO CAPS
25.0000 mg | ORAL_CAPSULE | ORAL | Status: DC | PRN
  Administered 2015-04-30: 25 mg via ORAL
  Filled 2015-04-30: qty 1

## 2015-04-30 MED ORDER — DIPHENHYDRAMINE HCL 50 MG/ML IJ SOLN
INTRAMUSCULAR | Status: AC
Start: 2015-04-30 — End: 2015-05-01
  Filled 2015-04-30: qty 1

## 2015-04-30 MED ORDER — FENTANYL CITRATE (PF) 100 MCG/2ML IJ SOLN
INTRAMUSCULAR | Status: DC | PRN
  Administered 2015-04-30 (×4): 50 ug via INTRAVENOUS

## 2015-04-30 MED ORDER — KETOROLAC TROMETHAMINE 30 MG/ML IJ SOLN
INTRAMUSCULAR | Status: DC | PRN
  Administered 2015-04-30: 30 mg via INTRAVENOUS

## 2015-04-30 MED ORDER — PHENYLEPHRINE HCL 10 MG/ML IJ SOLN
INTRAMUSCULAR | Status: DC | PRN
Start: 2015-04-30 — End: 2015-04-30
  Administered 2015-04-30: 100 ug via INTRAVENOUS

## 2015-04-30 MED ORDER — OXYCODONE HCL 5 MG PO TABS: 5.0000 mg | ORAL_TABLET | Freq: Once | ORAL | Status: DC | PRN

## 2015-04-30 MED ORDER — FENTANYL CITRATE (PF) 100 MCG/2ML IJ SOLN
25.0000 ug | INTRAMUSCULAR | Status: DC | PRN
  Administered 2015-04-30 (×2): 50 ug via INTRAVENOUS
  Administered 2015-04-30 (×2): 25 ug via INTRAVENOUS

## 2015-04-30 MED ORDER — BUPIVACAINE HCL (PF) 0.5 % IJ SOLN
INTRAMUSCULAR | Status: DC | PRN
  Administered 2015-04-30: 10 mL

## 2015-04-30 MED ORDER — MIDAZOLAM HCL 2 MG/2ML IJ SOLN
INTRAMUSCULAR | Status: DC | PRN
  Administered 2015-04-30: 2 mg via INTRAVENOUS

## 2015-04-30 MED ORDER — LIDOCAINE HCL (CARDIAC) 20 MG/ML IV SOLN
INTRAVENOUS | Status: DC | PRN
  Administered 2015-04-30: 60 mg via INTRAVENOUS

## 2015-04-30 MED ORDER — OXYCODONE HCL 5 MG/5ML PO SOLN: 5.0000 mg | Freq: Once | ORAL | Status: DC | PRN

## 2015-04-30 MED ORDER — LACTATED RINGERS IV SOLN
INTRAVENOUS | Status: DC | PRN
  Administered 2015-04-30: 13:00:00 via INTRAVENOUS

## 2015-04-30 MED ORDER — BUPIVACAINE HCL (PF) 0.5 % IJ SOLN
INTRAMUSCULAR | Status: AC
  Filled 2015-04-30: qty 30

## 2015-04-30 MED ORDER — DIPHENHYDRAMINE HCL 50 MG/ML IJ SOLN
12.5000 mg | Freq: Once | INTRAMUSCULAR | Status: AC | PRN
Start: 2015-04-30 — End: 2015-04-30
  Administered 2015-04-30: 12.5 mg via INTRAVENOUS

## 2015-04-30 SURGICAL SUPPLY — 38 items
BNDG GAUZE 4.5X4.1 6PLY STRL (MISCELLANEOUS) ×3 IMPLANT
CANISTER SUCT 1200ML W/VALVE (MISCELLANEOUS) IMPLANT
CHLORAPREP W/TINT 26ML (MISCELLANEOUS) IMPLANT
DRAIN PENROSE 1/4X12 LTX (DRAIN) IMPLANT
DRAPE LEGGINS SURG 28X43 STRL (DRAPES) ×3 IMPLANT
DRAPE PED LAPAROTOMY (DRAPES) ×3 IMPLANT
DRAPE UNDER BUTTOCK W/FLU (DRAPES) ×3 IMPLANT
GAUZE FLUFF 18X24 1PLY STRL (GAUZE/BANDAGES/DRESSINGS) IMPLANT
GAUZE SPONGE 4X4 12PLY STRL (GAUZE/BANDAGES/DRESSINGS) ×3 IMPLANT
GAUZE STRETCH 2X75IN STRL (MISCELLANEOUS) IMPLANT
GLOVE BIO SURGEON STRL SZ 6.5 (GLOVE) ×6 IMPLANT
GLOVE BIO SURGEON STRL SZ7 (GLOVE) ×9 IMPLANT
GLOVE BIO SURGEONS STRL SZ 6.5 (GLOVE) ×3
GOWN STRL REUS W/ TWL LRG LVL3 (GOWN DISPOSABLE) ×3 IMPLANT
GOWN STRL REUS W/TWL LRG LVL3 (GOWN DISPOSABLE) ×6
KIT RM TURNOVER STRD PROC AR (KITS) ×3 IMPLANT
LABEL OR SOLS (LABEL) ×3 IMPLANT
LIQUID BAND (GAUZE/BANDAGES/DRESSINGS) IMPLANT
NDL SAFETY 25GX1.5 (NEEDLE) ×3 IMPLANT
NS IRRIG 500ML POUR BTL (IV SOLUTION) ×9 IMPLANT
PACK BASIN MINOR ARMC (MISCELLANEOUS) ×3 IMPLANT
PAD ABD DERMACEA PRESS 5X9 (GAUZE/BANDAGES/DRESSINGS) ×3 IMPLANT
PAD GROUND ADULT SPLIT (MISCELLANEOUS) ×3 IMPLANT
PREP PVP WINGED SPONGE (MISCELLANEOUS) ×3 IMPLANT
SOL PREP PVP 2OZ (MISCELLANEOUS)
SOLUTION PREP PVP 2OZ (MISCELLANEOUS) IMPLANT
SUPPORETR ATHLETIC LG (MISCELLANEOUS) IMPLANT
SUPPORTER ATHLETIC LG (MISCELLANEOUS)
SUT ETHILON 3-0 FS-10 30 BLK (SUTURE)
SUT VIC AB 3-0 SH 27 (SUTURE)
SUT VIC AB 3-0 SH 27X BRD (SUTURE) IMPLANT
SUT VIC AB 4-0 SH 27 (SUTURE)
SUT VIC AB 4-0 SH 27XANBCTRL (SUTURE) IMPLANT
SUTURE EHLN 3-0 FS-10 30 BLK (SUTURE) IMPLANT
SWAB CULTURE AMIES ANAERIB BLU (MISCELLANEOUS) ×3 IMPLANT
SYR BULB IRRIG 60ML STRL (SYRINGE) ×3 IMPLANT
SYRINGE 10CC LL (SYRINGE) ×6 IMPLANT
SYRINGE IRR TOOMEY STRL 70CC (SYRINGE) ×3 IMPLANT

## 2015-04-30 NOTE — Transfer of Care (Signed)
Immediate Anesthesia Transfer of Care Note  Patient: Johnny KindlerDavid Gonzales  Procedure(s) Performed: Procedure(s): INCISION AND DRAINAGE ABSCESS/PERINEAL ABSCESS (N/A)  Patient Location: PACU  Anesthesia Type:General  Level of Consciousness: awake, alert  and oriented  Airway & Oxygen Therapy: Patient Spontanous Breathing and Patient connected to face mask oxygen  Post-op Assessment: Report given to RN and Post -op Vital signs reviewed and stable  Post vital signs: Reviewed and stable  Last Vitals:  Filed Vitals:   04/30/15 1339  BP: 146/95  Pulse: 85  Temp:   Resp: 13   Temp 98.5 Complications: No apparent anesthesia complications

## 2015-04-30 NOTE — Anesthesia Preprocedure Evaluation (Signed)
Anesthesia Evaluation  Patient identified by MRN, date of birth, ID band Patient awake    Reviewed: Allergy & Precautions, H&P , NPO status , Patient's Chart, lab work & pertinent test results, reviewed documented beta blocker date and time   Airway Mallampati: III  TM Distance: >3 FB Neck ROM: full    Dental no notable dental hx. (+) Teeth Intact, Caps   Pulmonary Current Smoker,  breath sounds clear to auscultation  Pulmonary exam normal       Cardiovascular negative cardio ROS Normal cardiovascular examRhythm:regular Rate:Normal     Neuro/Psych negative neurological ROS  negative psych ROS   GI/Hepatic negative GI ROS, Neg liver ROS,   Endo/Other  negative endocrine ROS  Renal/GU negative Renal ROS  negative genitourinary   Musculoskeletal   Abdominal   Peds  Hematology negative hematology ROS (+)   Anesthesia Other Findings Past Medical History:   Back pain                                                   Perineal Cellulitis with Malaise, Leukocytosis, and Refractory Pain  Signs and symptoms suggestive of sleep apnea     Reproductive/Obstetrics negative OB ROS                             Anesthesia Physical Anesthesia Plan  ASA: II  Anesthesia Plan: General LMA   Post-op Pain Management:    Induction:   Airway Management Planned:   Additional Equipment:   Intra-op Plan:   Post-operative Plan:   Informed Consent: I have reviewed the patients History and Physical, chart, labs and discussed the procedure including the risks, benefits and alternatives for the proposed anesthesia with the patient or authorized representative who has indicated his/her understanding and acceptance.   Dental Advisory Given  Plan Discussed with: Anesthesiologist, CRNA and Surgeon  Anesthesia Plan Comments:         Anesthesia Quick Evaluation

## 2015-04-30 NOTE — Progress Notes (Signed)
ANTIBIOTIC CONSULT NOTE - INITIAL  Pharmacy Consult for Vancomycin Dosing Indication: Cellulitis  No Known Allergies  Patient Measurements: Height: 6' (182.9 cm) Weight: 218 lb 14.4 oz (99.292 kg) IBW/kg (Calculated) : 77.6 Adjusted Body Weight: 89.8kg.   Vital Signs: Temp: 98.1 F (36.7 C) (07/18 2317) Temp Source: Oral (07/18 2317) BP: 116/77 mmHg (07/18 2317) Pulse Rate: 75 (07/18 2317) Intake/Output from previous day: 07/18 0701 - 07/19 0700 In: 3467 [P.O.:960; I.V.:2007; IV Piggyback:500] Out: 1625 [Urine:1625] Intake/Output from this shift: Total I/O In: 1722 [P.O.:720; I.V.:902; IV Piggyback:100] Out: 1125 [Urine:1125]  Labs:  Recent Labs  04/28/15 0921 04/28/15 0937 04/29/15 0601 04/30/15 0010  WBC  --  21.7* 20.6* 15.6*  HGB  --  12.9* 11.9* 11.2*  PLT  --  220 216 231  CREATININE 0.91  --  1.18  --    Estimated Creatinine Clearance: 88.4 mL/min (by C-G formula based on Cr of 1.18).  Recent Labs  04/30/15 0010  VANCOTROUGH 12     Microbiology: Recent Results (from the past 720 hour(s))  Culture, blood (routine x 2)     Status: None (Preliminary result)   Collection Time: 04/28/15  9:22 AM  Result Value Ref Range Status   Specimen Description BLOOD LEFT HAND  Final   Special Requests BOTTLES DRAWN AEROBIC AND ANAEROBIC  2CC  Final   Culture NO GROWTH < 24 HOURS  Final   Report Status PENDING  Incomplete  Culture, blood (routine x 2)     Status: None (Preliminary result)   Collection Time: 04/28/15  9:39 AM  Result Value Ref Range Status   Specimen Description BLOOD RIGHT ASSIST CONTROL  Final   Special Requests   Final    BOTTLES DRAWN AEROBIC AND ANAEROBIC  AER 3CC ANA 5CC   Culture NO GROWTH < 24 HOURS  Final   Report Status PENDING  Incomplete    Medical History: Past Medical History  Diagnosis Date  . Back pain     Medications:  Scheduled:  . ampicillin-sulbactam (UNASYN) IV  3 g Intravenous Q6H  . aspirin EC  81 mg Oral Daily   . docusate sodium  100 mg Oral BID  . heparin  5,000 Units Subcutaneous 3 times per day  . nicotine  14 mg Transdermal Daily  . pantoprazole  40 mg Oral Daily  . vancomycin  1,000 mg Intravenous Q8H   Infusions:  . sodium chloride 100 mL/hr at 04/29/15 2213   Assessment: Pharmacy consulted to dose vancomycin for 54 yo male admitted with cellulitus/scrotal edema. Patient also initiated on Unasyn 3g IV Q6hr.    Goal of Therapy:  Vancomycin trough level 10-15 mcg/ml  Plan:  Will continue patient on Vancomycin 1g IV Q8hr for goal trough of 10-15. Will obtain trough prior to 5th dose.    7/19:  Vanc trough @ 00:00 = 12 mcg/mL Will continue this pt on Vancomycin 1 gm IV Q8H.   Pharmacy will continue to monitor and adjust per consult.    Dimitry Holsworth D 04/30/2015,2:05 AM

## 2015-04-30 NOTE — Progress Notes (Signed)
Called Dr. Clint GuyHower about an order for benadryl per patient request for itching.  Doctor will put in order.  Arturo MortonClay, Eldridge Marcott N 04/30/2015  12:32 AM

## 2015-04-30 NOTE — Anesthesia Postprocedure Evaluation (Signed)
  Anesthesia Post-op Note  Patient: Johnny KindlerDavid Gonzales  Procedure(s) Performed: Procedure(s): INCISION AND DRAINAGE ABSCESS/PERINEAL ABSCESS (N/A)  Anesthesia type:General LMA  Patient location: PACU  Post pain: Pain level controlled  Post assessment: Post-op Vital signs reviewed, Patient's Cardiovascular Status Stable, Respiratory Function Stable, Patent Airway and No signs of Nausea or vomiting  Post vital signs: Reviewed and stable  Last Vitals:  Filed Vitals:   04/30/15 1514  BP: 131/88  Pulse: 85  Temp: 36.7 C  Resp:     Level of consciousness: awake, alert  and patient cooperative  Complications: No apparent anesthesia complications

## 2015-04-30 NOTE — Progress Notes (Signed)
Urology Consult Follow Up  Subjective: [name] is a 54 year old AA who scratched an itch in his perineal area sometime last week.  He developed scrotal pain a few days after the scratching.  It then become so severe that he presented to the ED on 04/28/2015.  He also stated he was having cold chills and fevers.  He was found to have scrotal and penile edema and with leukocytosis.  He was admitted for IV antibiotics.    Today, he is still experiencing scrotal pain.  He is not having difficulty urinating.  He is not having any drainage from the scrotal or perineal area.  He is currently NPO and scheduled for I & D of the abscess with Dr. Edwyna ShellHart.    Anti-indurated: Anti-infectives    Start     Dose/Rate Route Frequency Ordered Stop   04/28/15 1630  vancomycin (VANCOCIN) IVPB 1000 mg/200 mL premix     1,000 mg 200 mL/hr over 60 Minutes Intravenous Every 8 hours 04/28/15 1420     04/28/15 1330  Ampicillin-Sulbactam (UNASYN) 3 g in sodium chloride 0.9 % 100 mL IVPB     3 g 100 mL/hr over 60 Minutes Intravenous Every 6 hours 04/28/15 1327     04/28/15 1130  vancomycin (VANCOCIN) IVPB 1000 mg/200 mL premix     1,000 mg 200 mL/hr over 60 Minutes Intravenous  Once 04/28/15 1117 04/28/15 1445   04/28/15 1130  Ampicillin-Sulbactam (UNASYN) 3 g in sodium chloride 0.9 % 100 mL IVPB     3 g 100 mL/hr over 60 Minutes Intravenous  Once 04/28/15 1117 04/28/15 1226      Current Facility-Administered Medications  Medication Dose Route Frequency Provider Last Rate Last Dose  . 0.9 %  sodium chloride infusion   Intravenous Continuous Marguarite ArbourJeffrey D Sparks, MD 100 mL/hr at 04/29/15 2213    . acetaminophen (TYLENOL) tablet 650 mg  650 mg Oral Q6H PRN Marguarite ArbourJeffrey D Sparks, MD       Or  . acetaminophen (TYLENOL) suppository 650 mg  650 mg Rectal Q6H PRN Marguarite ArbourJeffrey D Sparks, MD      . Ampicillin-Sulbactam (UNASYN) 3 g in sodium chloride 0.9 % 100 mL IVPB  3 g Intravenous Q6H Marguarite ArbourJeffrey D Sparks, MD   3 g at 04/30/15 0154  .  aspirin EC tablet 81 mg  81 mg Oral Daily Marguarite ArbourJeffrey D Sparks, MD   81 mg at 04/29/15 0936  . bisacodyl (DULCOLAX) suppository 10 mg  10 mg Rectal Daily PRN Marguarite ArbourJeffrey D Sparks, MD      . diphenhydrAMINE (BENADRYL) capsule 25 mg  25 mg Oral Q4H PRN Wyatt Hasteavid K Hower, MD   25 mg at 04/30/15 0049  . docusate sodium (COLACE) capsule 100 mg  100 mg Oral BID Marguarite ArbourJeffrey D Sparks, MD   100 mg at 04/29/15 2212  . heparin injection 5,000 Units  5,000 Units Subcutaneous 3 times per day Marguarite ArbourJeffrey D Sparks, MD   5,000 Units at 04/30/15 0529  . HYDROcodone-acetaminophen (NORCO/VICODIN) 5-325 MG per tablet 1-2 tablet  1-2 tablet Oral Q4H PRN Marguarite ArbourJeffrey D Sparks, MD   2 tablet at 04/30/15 0540  . morphine 2 MG/ML injection 2 mg  2 mg Intravenous Q2H PRN Marguarite ArbourJeffrey D Sparks, MD   2 mg at 04/30/15 0100  . nicotine (NICODERM CQ - dosed in mg/24 hours) patch 14 mg  14 mg Transdermal Daily Shaune PollackQing Chen, MD   14 mg at 04/29/15 1437  . ondansetron (ZOFRAN) tablet 4 mg  4 mg Oral Q6H PRN Marguarite Arbour, MD       Or  . ondansetron [facility]) injection 4 mg  4 mg Intravenous Q6H PRN Marguarite Arbour, MD      . pantoprazole (PROTONIX) EC tablet 40 mg  40 mg Oral Daily Marguarite Arbour, MD   40 mg at 04/29/15 0936  . vancomycin (VANCOCIN) IVPB 1000 mg/200 mL premix  1,000 mg Intravenous Q8H Enid Baas, MD   1,000 mg at 04/30/15 0049     Objective: Vital signs in last 24 hours: Temp:  [97.8 F (36.6 C)-99.3 F (37.4 C)] 98.1 F (36.7 C) (07/18 2317) Pulse Rate:  [75-79] 75 (07/18 2317) Resp:  [16-17] 16 (07/18 2317) BP: (104-116)/(62-77) 116/77 mmHg (07/18 2317) SpO2:  [100 %] 100 % (07/18 2317) Weight:  [225 lb 4.8 oz (102.195 kg)] 225 lb 4.8 oz (102.195 kg) (07/19 0354)  Intake/Output from previous day: 07/18 0701 - 07/19 0700 In: 3942 [P.O.:960; I.V.:2382; IV Piggyback:600] Out: 2525 [Urine:2525] Intake/Output this shift:     Physical Exam GU:  Penis is circumcised and edema has decreased.  The scrotum is without lesion  or drainage.  An indurated area extends from the base of the left scrotum into the perineal area, almost to the rectum.  The left testicle is tender.    Lab Results:   Recent Labs  04/29/15 0601 04/30/15 0010  WBC 20.6* 15.6*  HGB 11.9* 11.2*  HCT 36.5* 33.7*  PLT 216 231   BMET  Recent Labs  04/29/15 0601 04/30/15 0010  NA 136 139  K 3.4* 3.7  CL 105 110  CO2 24 21*  GLUCOSE 128* 125*  BUN 8 10  CREATININE 1.18 1.17  CALCIUM 8.1* 8.0*   PT/INR No results for input(s): LABPROT, INR in the last 72 hours. ABG No results for input(s): PHART, HCO3 in the last 72 hours.  Invalid input(s): PCO2, PO2  Studies/Results: Ct Abdomen Pelvis W Wo Contrast  04/28/2015   CLINICAL DATA:  Groin swelling and soreness.  Evaluate for gangrene.  EXAM: CT ABDOMEN AND PELVIS WITHOUT AND WITH CONTRAST  TECHNIQUE: Multidetector CT imaging of the abdomen and pelvis was performed following the standard protocol before and following the bolus administration of intravenous contrast.  CONTRAST:  25mL OMNIPAQUE IOHEXOL 240 MG/ML SOLN, OMNIPAQUE IOHEXOL 300 MG/ML SOLN  COMPARISON:  01/21/2012 and ultrasound of the scrotum dated 04/28/2015  FINDINGS: Lower chest:  No pleural effusion.  The lung bases appear clear  Hepatobiliary: Calcified granulomas are identified within the liver. The gallbladder appears within normal limits. No biliary dilatation.  Pancreas: Normal appearance of the pancreas.  Spleen: Normal appearance of the spleen.  Adrenals/Urinary Tract: The right adrenal gland is normal. There is an adenoma within the left adrenal gland measuring 1.3 cm, image 22/series 2. No nephrolithiasis or hydronephrosis. The urinary bladder appears normal.  Stomach/Bowel: The stomach is within normal limits. The small bowel loops have a normal course and caliber. No obstruction. Normal appearance of the colon. The appendix is visualized and appears normal.  Vascular/Lymphatic: The abdominal aorta appears  normal. There is no upper abdominal adenopathy. 9 mm left external iliac lymph node identified. Several prominent inguinal nodes are noted without adenopathy.  Reproductive: Prostate gland and seminal vesicles are unremarkable. Small bilateral hydroceles noted. There is diffuse skin thickening and subcutaneous fat stranding involving the scrotum and perineum. No gas identified within the scrotum. No fluid collections identified.  Other: No free fluid or fluid collections  identified.  Musculoskeletal: The visualized osseous structures are negative for aggressive lytic or sclerotic bone lesion. There is degenerative disc disease identified involving the lumbar spine.  IMPRESSION: 1. Scrotal cellulitis.  No evidence for abscess or soft tissue gas. 2. Small bilateral hydroceles. 3. Left adrenal gland adenoma. 4. Prior granulomatous disease.   Electronically Signed   By: Signa Kell M.D.   On: 04/28/2015 13:28   US Scrotum  04/28/2015   CLINICAL DATA:  Bilateral testicular pain/swelling x2 days  EXAM: SCROTAL ULTRASOUND  DOPPLER ULTRASOUND OF THE TESTICLES  TECHNIQUE: Complete ultrasound examination of the testicles, epididymis, and other scrotal structures was performed. Color and spectral Doppler ultrasound were also utilized to evaluate blood flow to the testicles.  COMPARISON:  None.  FINDINGS: Right testicle  Measurements: 4.5 x 2.4 x 2.3 cm. No mass or microlithiasis visualized.  Left testicle  Measurements: 4.3 x 2.2 x 2.7 cm. No mass or microlithiasis visualized.  Right epididymis:  2 x 3 x 3 mm epididymal head cyst.  Left epididymis: Multiple small epididymal cysts measuring up to 3 x 3 x 4 mm.  Hydrocele:  Small bilateral hydroceles.  Varicocele:  None visualized.  Pulsed Doppler interrogation of both testes demonstrates normal low resistance arterial and venous waveforms bilaterally.  IMPRESSION: Normal sonographic appearance of the bilateral testes.  No evidence of testicular torsion.  Small bilateral  hydroceles.   Electronically Signed   By: Charline Bills M.D.   On: 04/28/2015 11:22   [place] Art/ven Flow Abd Pelv Doppler  04/28/2015   CLINICAL DATA:  Bilateral testicular pain/swelling x2 days  EXAM: SCROTAL ULTRASOUND  DOPPLER ULTRASOUND OF THE TESTICLES  TECHNIQUE: Complete ultrasound examination of the testicles, epididymis, and other scrotal structures was performed. Color and spectral Doppler ultrasound were also utilized to evaluate blood flow to the testicles.  COMPARISON:  None.  FINDINGS: Right testicle  Measurements: 4.5 x 2.4 x 2.3 cm. No mass or microlithiasis visualized.  Left testicle  Measurements: 4.3 x 2.2 x 2.7 cm. No mass or microlithiasis visualized.  Right epididymis:  2 x 3 x 3 mm epididymal head cyst.  Left epididymis: Multiple small epididymal cysts measuring up to 3 x 3 x 4 mm.  Hydrocele:  Small bilateral hydroceles.  Varicocele:  None visualized.  Pulsed Doppler interrogation of both testes demonstrates normal low resistance arterial and venous waveforms bilaterally.  IMPRESSION: Normal sonographic appearance of the bilateral testes.  No evidence of testicular torsion.  Small bilateral hydroceles.   Electronically Signed   By: Charline Bills M.D.   On: 04/28/2015 11:22     Assessment:  Significant Perineal Cellulitis with Malaise, Leukocytosis, and Refractory Pain  s/p Procedure(s): INCISION AND DRAINAGE ABSCESS/PERINEAL ABSCESS TO BE COMPLETED TODAY  Plan: Patient to be taken to the OR today for I & D of scrotum  Patient to be NPO    LOS: 2 days    [facility] 04/30/2015   I have seen and examined the patient, labs/ imaging reviewed and discussed his management with Michiel Cowboy, PA.  I reviewed the PA's note and agree with the documented findings and plan of care. Plan to procede to OR with I&D.    Vanna Scotland, MD

## 2015-04-30 NOTE — Brief Op Note (Signed)
04/28/2015 - 04/30/2015  1:50 PM  PATIENT:  Johnny Gonzales  54 y.o. male  PRE-OPERATIVE DIAGNOSIS:  PERINEAL ABSCESS  POST-OPERATIVE DIAGNOSIS:  Perineal abscess  PROCEDURE:  Procedure(s): INCISION AND DRAINAGE ABSCESS/PERINEAL ABSCESS (N/A)  SURGEON:  Surgeon(s) and Role:    * Vanna ScotlandAshley Dresden Lozito, MD - Primary  ASSISTANTS: none   ANESTHESIA:   local and general  EBL:  Total I/O In: 1146 [I.V.:1046; IV Piggyback:100] Out: 400 [Urine:400]  Drains: none  Specimen: wound culture  COUNTS CORRECT: YES  PLAN OF CARE: Admit to inpatient   PATIENT DISPOSITION:  PACU - hemodynamically stable.

## 2015-04-30 NOTE — Progress Notes (Signed)
[facility] [place] [facility]   PATIENT NAME: Johnny Gonzales    MR#:  [id number]  DATE OF BIRTH:  1961-05-29  SUBJECTIVE:  CHIEF COMPLAINT:   Chief Complaint  Patient presents with  . Groin Swelling   still scrotal tenderness and swelling.  REVIEW OF SYSTEMS:  CONSTITUTIONAL: No fever, fatigue or weakness.  EYES: No blurred or double vision.  EARS, NOSE, AND THROAT: No tinnitus or ear pain.  RESPIRATORY: No cough, shortness of breath, wheezing or hemoptysis.  CARDIOVASCULAR: No chest pain, orthopnea, edema.  GASTROINTESTINAL: No nausea, vomiting, diarrhea or abdominal pain.  GENITOURINARY: No dysuria, hematuria.  scrotal tenderness and swelling. ENDOCRINE: No polyuria, nocturia,  HEMATOLOGY: No anemia, easy bruising or bleeding SKIN: No rash or lesion. MUSCULOSKELETAL: No joint pain or arthritis.   NEUROLOGIC: No tingling, numbness, weakness.  PSYCHIATRY: No anxiety or depression.   DRUG ALLERGIES:  No Known Allergies  VITALS:  Blood pressure 146/95, pulse 85, temperature 98.5 F (36.9 C), temperature source Oral, resp. rate 18, height 6' (1.829 m), weight 102.195 kg (225 lb 4.8 oz), SpO2 100 %.  PHYSICAL EXAMINATION:  GENERAL:  54 y.o.-year-old patient lying in the bed with no acute distress.  EYES: Pupils equal, round, reactive to light and accommodation. No scleral icterus. Extraocular muscles intact.  HEENT: Head atraumatic, normocephalic. Oropharynx and nasopharynx clear.  NECK:  Supple, no jugular venous distention. No thyroid enlargement, no tenderness.  LUNGS: Normal breath sounds bilaterally, no wheezing, rales,rhonchi or crepitation. No use of accessory muscles of respiration.  CARDIOVASCULAR: S1, S2 normal. No murmurs, rubs, or gallops.  ABDOMEN: Soft, nontender, nondistended. Bowel sounds present. No organomegaly or mass.  Scrotal tenderness and swelling. Tenderness on the left side of the scrotum and left groin. EXTREMITIES: No  pedal edema, cyanosis, or clubbing.  NEUROLOGIC: Cranial nerves II through XII are intact. Muscle strength 5/5 in all extremities. Sensation intact. Gait not checked.  PSYCHIATRIC: The patient is alert and oriented x 3.  SKIN: No obvious rash, lesion, or ulcer.    LABORATORY PANEL:   CBC  Recent Labs Lab 04/30/15 0010  WBC 15.6*  HGB 11.2*  HCT 33.7*  PLT 231   ------------------------------------------------------------------------------------------------------------------  Chemistries   Recent Labs Lab 04/29/15 0601 04/30/15 0010  NA 136 139  K 3.4* 3.7  CL 105 110  CO2 24 21*  GLUCOSE 128* 125*  BUN 8 10  CREATININE 1.18 1.17  CALCIUM 8.1* 8.0*  MG 2.2  --   AST 13*  --   ALT 13*  --   ALKPHOS 55  --   BILITOT 0.4  --    ------------------------------------------------------------------------------------------------------------------  Cardiac Enzymes No results for input(s): TROPONINI in the last 168 hours. ------------------------------------------------------------------------------------------------------------------  RADIOLOGY:  No results found.  EKG:  No orders found for this or any previous visit.  ASSESSMENT AND PLAN:   Scrotal cellulitis and perineal abscess with leukocytosis. I&D of scrotum today. Continue vancomycin and Unasyn. Follow-up blood culture and wound culture. Leukocytosis is better.  Hematuria. Urinalysis show hematuria, improved per repeated urinalysis.  Hypokalemia. Give potassium supplement. Improved.  Morbid obesity  Tobacco abuse. Smoking cessation was counseled for 3-4 minutes and give nicotine patch.   All the records are reviewed and case discussed with Care Management/Social Workerr. Management plans discussed with the patient, his brother and they are in agreement.  CODE STATUS: Full code  TOTAL TIME TAKING CARE OF THIS PATIENT: 38 minutes.   POSSIBLE D/C IN 2 DAYS, DEPENDING ON CLINICAL  CONDITION.   Shaune Pollack M.D on 04/30/2015 at 1:47 PM  Between 7am to 6pm - Pager - [phone]  After 6pm go to [email or web address] - password [email or web address] [facility]  [facility]  Office  [phone]  CC: Primary care physician; No PCP Per Patient

## 2015-04-30 NOTE — Op Note (Signed)
Date of procedure: 04/30/2015  Preoperative diagnosis:  1. Perineal abscess  2. Penoscrotal cellulitis/edema  Postoperative diagnosis:  1. As above   Procedure: 1. Incision and drainage of perineal abscess  Surgeon: Vanna ScotlandAshley Elissia Spiewak, MD  Anesthesia: General  Complications: None  Intraoperative findings: Large purulent pocket of approximately 20 cc of drained.  Wound copiously irrigated. Packed with Kerlix.  EBL: minimal  Specimens: wound culture  Drains: none  Indication: Johnny Gonzales is a 54 y.o. patient with severe penoscrotal cellulitis, edema was admitted over the weekend for IV antibiotics. CT scan and ultrasound shows no obvious fluid collection or no concern for Fournier's gangrene. Over the past few days, his perineum has become more fluctuant and tender concerning for possible abscess formation..  After reviewing the management options for treatment, he elected to proceed with the above surgical procedure(s). We have discussed the potential benefits and risks of the procedure, side effects of the proposed treatment, the likelihood of the patient achieving the goals of the procedure, and any potential problems that might occur during the procedure or recuperation. Informed consent has been obtained.  Description of procedure:  The patient was taken to the operating room and general anesthesia was induced.  The patient was placed in the dorsal lithotomy position, prepped and draped in the usual sterile fashion, but no additional antibiotics were administered as had aready reach steady state on vancomycin and Unasyn. A preoperative time-out was performed.   At this point time, the perineum was carefully inspected. There was a fluctuant appearing area at the base of the scrotum/perineum which was consistent with probable abscess. The overlying skin was instilled with 10 cc of local anesthesia. An 11 blade knife was then used to incise an approximately 3 cm vertical incision  overlying this area. Immediately on incision, there was a copious amount of purulent material which was drained from the collection. The wound cavity was explored digitally anterior and slightly posterior to open up any loculations within the cavity. The overall cavity sizes fairly sizable. Wound cultures were taken from within the bed of the abscess cavity.  A small amount of fibrinous exudate on the surface of the abscess cavity was excised using Metzenbaum scissors. The wound did not appear to involve the urethra or the rectum.  The wound was then copiously irrigated using at least 1 L of normal saline.  The wound cavity was then packed using a Kerlix and 4 x 4's ABDs and mesh panties were applied as a dressing. The patient was repositioned in the supine position, reversed from anesthesia, and taken to the PACU in stable condition. There were no complications in this case.  Vanna ScotlandAshley Krystyl Cannell, M.D.

## 2015-05-01 DIAGNOSIS — L03315 Cellulitis of perineum: Secondary | ICD-10-CM

## 2015-05-01 LAB — CREATININE, SERUM: CREATININE: 1.14 mg/dL (ref 0.61–1.24)

## 2015-05-01 LAB — GLUCOSE, CAPILLARY
Glucose-Capillary: 113 mg/dL — ABNORMAL HIGH (ref 65–99)
Glucose-Capillary: 102 mg/dL — ABNORMAL HIGH (ref 65–99)

## 2015-05-01 LAB — VANCOMYCIN, TROUGH: VANCOMYCIN TR: 14 ug/mL (ref 10–20)

## 2015-05-01 LAB — CBC
HEMATOCRIT: 34.1 % — AB (ref 40.0–52.0)
HEMOGLOBIN: 11.3 g/dL — AB (ref 13.0–18.0)
MCH: 27.6 pg (ref 26.0–34.0)
MCHC: 33.1 g/dL (ref 32.0–36.0)
MCV: 83.4 fL (ref 80.0–100.0)
Platelets: 287 10*3/uL (ref 150–440)
RBC: 4.09 MIL/uL — AB (ref 4.40–5.90)
RDW: 13.6 % (ref 11.5–14.5)
WBC: 10.1 10*3/uL (ref 3.8–10.6)

## 2015-05-01 MED ORDER — HYDROCODONE-ACETAMINOPHEN 5-325 MG PO TABS: 1.0000 | ORAL_TABLET | Freq: Four times a day (QID) | ORAL | Status: DC | PRN

## 2015-05-01 MED ORDER — HYDROMORPHONE HCL 2 MG PO TABS
2.0000 mg | ORAL_TABLET | ORAL | Status: AC
  Administered 2015-05-01: 2 mg via ORAL
  Filled 2015-05-01: qty 1

## 2015-05-01 MED ORDER — LORAZEPAM 1 MG PO TABS
1.0000 mg | ORAL_TABLET | ORAL | Status: AC
  Administered 2015-05-01: 1 mg via ORAL
  Filled 2015-05-01: qty 1

## 2015-05-01 MED ORDER — CLINDAMYCIN HCL 300 MG PO CAPS: 300.0000 mg | ORAL_CAPSULE | Freq: Three times a day (TID) | ORAL | Status: DC

## 2015-05-01 NOTE — Care Management (Signed)
Gave Rx to Cyndia SkeetersJames Tischer who went [facility] to [facility] up [facility],Cleocin, informed Nurse. Script for Norco remains in chart.  [name] from [facility]  to room to discuss wound care nurse. Brother [name] is former Astronomer[employer] Corpsman with [employer], drillingmedical training. Stated that he will be able to assist with wound care.

## 2015-05-01 NOTE — Discharge Summary (Signed)
[facility] - [place] at [facility]   PATIENT NAME: Johnny Gonzales    MR#:  [id number]  DATE OF BIRTH:  09/17/1961  DATE OF ADMISSION:  04/28/2015 ADMITTING PHYSICIAN: Marguarite Arbour, MD  DATE OF DISCHARGE: 05/01/2015  PRIMARY CARE PHYSICIAN: No PCP Per Patient    ADMISSION DIAGNOSIS:  Cellulitis of groin [L03.314] Testicle pain [N50.8] Testicle swelling [N50.8] Scrotum, abscess [N49.2]   DISCHARGE DIAGNOSIS:   1. Perineal abscess 2. Penoscrotal cellulitis/edema SECONDARY DIAGNOSIS:   Past Medical History  Diagnosis Date  . Back pain     HOSPITAL COURSE:   Scrotal cellulitis and perineal abscess with leukocytosis.  The patient has been treated with vancomycin and Unasyn. He got I&D of scrotum yesterday. Leukocytosis improved. Will change to po clindamycin. Wound culture: MODERATE GROWTH STAPHYLOCOCCUS AUREUS. Follow up urologist as outpatient.  Hematuria. Urinalysis show hematuria, improved per repeated urinalysis.  Hypokalemia. Give potassium supplement. Improved.  Morbid obesity  Tobacco abuse. Smoking cessation was counseled for 3-4 minutes and give nicotine patch.   DISCHARGE CONDITIONS:   Stable. Discharge to home today.  CONSULTS OBTAINED:  Treatment Team:  Sebastian Ache, MD  DRUG ALLERGIES:  No Known Allergies  DISCHARGE MEDICATIONS:   Current Discharge Medication List    START taking these medications   Details  clindamycin (CLEOCIN) 300 MG capsule Take 1 capsule (300 mg total) by mouth 3 (three) times daily. Qty: 30 capsule, Refills: 0    HYDROcodone-acetaminophen (NORCO/VICODIN) 5-325 MG per tablet Take 1 tablet by mouth every 6 (six) hours as needed for moderate pain. Qty: 16 tablet, Refills: 0      CONTINUE these medications which have NOT CHANGED   Details  Aspirin-Salicylamide-Caffeine (BC HEADACHE POWDER PO) Take 1 packet by mouth daily as needed (pain and stiffness.).    hydrOXYzine (ATARAX/VISTARIL) 25  MG tablet Take 1 tablet (25 mg total) by mouth every 8 (eight) hours as needed for itching. Qty: 15 tablet, Refills: 0      STOP taking these medications     sulfamethoxazole-trimethoprim (BACTRIM) 400-80 MG per tablet          DISCHARGE INSTRUCTIONS:    If you experience worsening of your admission symptoms, develop shortness of breath, life threatening emergency, suicidal or homicidal thoughts you must seek medical attention immediately by calling 911 or calling your MD immediately  if symptoms less severe.  You Must read complete instructions/literature along with all the possible adverse reactions/side effects for all the Medicines you take and that have been prescribed to you. Take any new Medicines after you have completely understood and accept all the possible adverse reactions/side effects.   Please note  You were cared for by a hospitalist during your hospital stay. If you have any questions about your discharge medications or the care you received while you were in the hospital after you are discharged, you can call the unit and asked to speak with the hospitalist on call if the hospitalist that took care of you is not available. Once you are discharged, your primary care physician will handle any further medical issues. Please note that NO REFILLS for any discharge medications will be authorized once you are discharged, as it is imperative that you return to your primary care physician (or establish a relationship with a primary care physician if you do not have one) for your aftercare needs so that they can reassess your need for medications and monitor your lab values.    Today   SUBJECTIVE  Scrotal pain.   VITAL SIGNS:  Blood pressure 141/92, pulse 80, temperature 98.4 F (36.9 C), temperature source Oral, resp. rate 16, height 6' (1.829 m), weight 102.331 kg (225 lb 9.6 oz), SpO2 100 %.  I/O:   Intake/Output Summary (Last 24 hours) at 05/01/15 1345 Last data  filed at 05/01/15 1136  Gross per 24 hour  Intake   2578 ml  Output   2201 ml  Net    377 ml    PHYSICAL EXAMINATION:  GENERAL:  54 y.o.-year-old patient lying in the bed with no acute distress.  EYES: Pupils equal, round, reactive to light and accommodation. No scleral icterus. Extraocular muscles intact.  HEENT: Head atraumatic, normocephalic. Oropharynx and nasopharynx clear.  NECK:  Supple, no jugular venous distention. No thyroid enlargement, no tenderness.  LUNGS: Normal breath sounds bilaterally, no wheezing, rales,rhonchi or crepitation. No use of accessory muscles of respiration.  CARDIOVASCULAR: S1, S2 normal. No murmurs, rubs, or gallops.  ABDOMEN: Soft, non-tender, non-distended. Bowel sounds present. No organomegaly or mass. Better swelling of scrotum, but still tenderness. EXTREMITIES: No pedal edema, cyanosis, or clubbing.  NEUROLOGIC: Cranial nerves II through XII are intact. Muscle strength 5/5 in all extremities. Sensation intact. Gait not checked.  PSYCHIATRIC: The patient is alert and oriented x 3.  SKIN: No obvious rash, lesion, or ulcer.   DATA REVIEW:   CBC  Recent Labs Lab 05/01/15 0413  WBC 10.1  HGB 11.3*  HCT 34.1*  PLT 287    Chemistries   Recent Labs Lab 04/29/15 0601 04/30/15 0010  NA 136 139  K 3.4* 3.7  CL 105 110  CO2 24 21*  GLUCOSE 128* 125*  BUN 8 10  CREATININE 1.18 1.17  CALCIUM 8.1* 8.0*  MG 2.2  --   AST 13*  --   ALT 13*  --   ALKPHOS 55  --   BILITOT 0.4  --     Cardiac Enzymes No results for input(s): TROPONINI in the last 168 hours.  Microbiology Results  Results for orders placed or performed during the hospital encounter of 04/28/15  Culture, blood (routine x 2)     Status: None (Preliminary result)   Collection Time: 04/28/15  9:22 AM  Result Value Ref Range Status   Specimen Description BLOOD LEFT HAND  Final   Special Requests BOTTLES DRAWN AEROBIC AND ANAEROBIC  2CC  Final   Culture NO GROWTH 2 DAYS   Final   Report Status PENDING  Incomplete  Culture, blood (routine x 2)     Status: None (Preliminary result)   Collection Time: 04/28/15  9:39 AM  Result Value Ref Range Status   Specimen Description BLOOD RIGHT ASSIST CONTROL  Final   Special Requests   Final    BOTTLES DRAWN AEROBIC AND ANAEROBIC  AER 3CC ANA 5CC   Culture NO GROWTH 2 DAYS  Final   Report Status PENDING  Incomplete  Wound culture     Status: None (Preliminary result)   Collection Time: 04/30/15  9:03 AM  Result Value Ref Range Status   Specimen Description ABSCESS SCROTAL ABCESS   DR.[facility]  Final   Special Requests NONE  Final   Gram Stain PENDING  Incomplete   Culture   Final    MODERATE GROWTH STAPHYLOCOCCUS AUREUS SUSCEPTIBILITIES TO FOLLOW    Report Status PENDING  Incomplete    RADIOLOGY:  No results found.      Management plans discussed with the patient, family and they are in  agreement.  CODE STATUS:     Code Status Orders        Start     Ordered   04/28/15 1455  Full code   Continuous     04/28/15 1454      TOTAL TIME TAKING CARE OF THIS PATIENT: 35 minutes.    Shaune Pollack M.D on 05/01/2015 at 1:45 PM  Between 7am to 6pm - Pager - [phone]  After 6pm go to [email or web address] - password [email or web address]  [place] [facility]  Office  [phone]  CC: Primary care physician; No PCP Per Patient

## 2015-05-01 NOTE — Discharge Instructions (Signed)
Low fat diet. Activity as tolerated. Smoking cessation   Wet to dry dressing; change daily   Pack wet Kerlix into the wound cavity in scrotal area. Top it off with moist gauze and abdominal pad.

## 2015-05-01 NOTE — Care Management (Signed)
Recieved a call from [name] form [facility]. Per [name] patient will not qualify for charity care due to having available resources. [name] stated to me that patient was offered nurse at $150. Patient refused self pay. Spoke with Dr [name] concerning home health. She stated that the brother [name] had observed her do dressing change and that she felt confident that he would be able to perform dressing changes. Supplies given to the brother [name] who accompanied patient.

## 2015-05-01 NOTE — Care Management (Signed)
Spoke with the brother [name] again who is on the floor. He stated that he will be able to do dressing changes. Gave the brother instructions on where to pick up medications ,[facility], and on applying for [facility] and also information for [facility].  He expressed understanding and agrees to help patient. Unit secretary attempted to make a follow up at [facility] but was told by patient that he "would handle it". Patient has follow up with Dr [name]. Awaiting [name] from [facility] to speak with patient about [facility] nurse.

## 2015-05-01 NOTE — Care Management (Signed)
Spoke with patient for discharge planning. Patient is from home alone and stated that he has a job at [employer] where he makes about $900 monthly. Arranged for medication fill at [facility]. Application faxed and received by Rita [phone]. [facility] Patient provided coupon for discount and price will be 13.70. Patient will need wound dressing changes daily and brother Cyndia SkeetersJames Chauca [phone] has agreed to help with this . Spoke with [name] at [facility] who agrees to setup home health nursing for several visits to teach patient and insure properly done. Informed Dr [name] of this.

## 2015-05-01 NOTE — Progress Notes (Signed)
ANTIBIOTIC CONSULT NOTE - INITIAL  Pharmacy Consult for Vancomycin Dosing Indication: Cellulitis  No Known Allergies  Patient Measurements: Height: 6' (182.9 cm) Weight: 225 lb 9.6 oz (102.331 kg) IBW/kg (Calculated) : 77.6 Adjusted Body Weight: 89.8kg.  Body mass index is 30.59 kg/(m^2).   Vital Signs: Temp: 98.4 F (36.9 C) (07/20 0737) Temp Source: Oral (07/20 0737) BP: 141/92 mmHg (07/20 0737) Pulse Rate: 80 (07/20 0737) Intake/Output from previous day: 07/19 0701 - 07/20 0700 In: 3154 [P.O.:240; I.V.:1889; IV Piggyback:1025] Out: 1200 [Urine:1200] Intake/Output from this shift: Total I/O In: 570 [P.O.:240; I.V.:330] Out: 1101 [Urine:1100; Stool:1]  Labs:  Recent Labs  04/29/15 0601 04/30/15 0010 05/01/15 0413  WBC 20.6* 15.6* 10.1  HGB 11.9* 11.2* 11.3*  PLT 216 231 287  CREATININE 1.18 1.17  --    Estimated Creatinine Clearance: 90.4 mL/min (by C-G formula based on Cr of 1.17).  Recent Labs  04/30/15 0010  VANCOTROUGH 12     Microbiology: Recent Results (from the past 720 hour(s))  Culture, blood (routine x 2)     Status: None (Preliminary result)   Collection Time: 04/28/15  9:22 AM  Result Value Ref Range Status   Specimen Description BLOOD LEFT HAND  Final   Special Requests BOTTLES DRAWN AEROBIC AND ANAEROBIC  2CC  Final   Culture NO GROWTH 2 DAYS  Final   Report Status PENDING  Incomplete  Culture, blood (routine x 2)     Status: None (Preliminary result)   Collection Time: 04/28/15  9:39 AM  Result Value Ref Range Status   Specimen Description BLOOD RIGHT ASSIST CONTROL  Final   Special Requests   Final    BOTTLES DRAWN AEROBIC AND ANAEROBIC  AER 3CC ANA 5CC   Culture NO GROWTH 2 DAYS  Final   Report Status PENDING  Incomplete  Wound culture     Status: None (Preliminary result)   Collection Time: 04/30/15  9:03 AM  Result Value Ref Range Status   Specimen Description ABSCESS SCROTAL ABCESS   [facility]  Final   Special Requests NONE   Final   Gram Stain PENDING  Incomplete   Culture   Final    MODERATE GROWTH STAPHYLOCOCCUS AUREUS SUSCEPTIBILITIES TO FOLLOW    Report Status PENDING  Incomplete    Medical History: Past Medical History  Diagnosis Date  . Back pain     Medications:  Scheduled:  . ampicillin-sulbactam (UNASYN) IV  3 g Intravenous Q6H  . aspirin EC  81 mg Oral Daily  . docusate sodium  100 mg Oral BID  . heparin  5,000 Units Subcutaneous 3 times per day  . nicotine  14 mg Transdermal Daily  . pantoprazole  40 mg Oral Daily  . vancomycin  1,000 mg Intravenous Q8H   Infusions:  . sodium chloride 100 mL/hr at 05/01/15 0402   Assessment: Pharmacy consulted to dose vancomycin for 54 yo male admitted with cellulitus/scrotal edema. Patient also initiated on Unasyn 3g IV Q6hr.    Pt noted to have perineal abscess s/p I&D on 7/19. WCx growing staph aureus (sensitivities pending)  Goal of Therapy:  Vancomycin trough level 10-15 mcg/ml >>increase goal to 15-20 due to abscess  Plan:  Will continue patient on Vancomycin 1g IV Q8hr for goal trough of 10-15. Will obtain trough prior to 5th dose.    7/19:  Vanc trough @ 00:00 = 12 mcg/mL Will continue this pt on Vancomycin 1 gm IV Q8H at this time.  7/20: Will check another  trough today (7/20) at 1600 at steady state. SCr also ordered to monitor renal function. Based on trough level, will adjust dosing for new goal of 15-20.   Pharmacy will continue to monitor and adjust per consult.    Crist Fat L 05/01/2015,11:26 AM

## 2015-05-01 NOTE — Progress Notes (Signed)
Urology Consult Follow Up  Subjective: Patient doing somewhat better today.  Remains afebrile. WBC normalized.   Allowed nurse to remove packing but no repack wound.  Primary team planning for discharge this afternoon. Transition to by mouth clindamycin. Wound cultures still pending from OR.  Anti-infectives: Anti-infectives    Start     Dose/Rate Route Frequency Ordered Stop   05/01/15 0000  clindamycin (CLEOCIN) 300 MG capsule     300 mg Oral 3 times daily 05/01/15 1004     04/28/15 1630  vancomycin (VANCOCIN) IVPB 1000 mg/200 mL premix     1,000 mg 200 mL/hr over 60 Minutes Intravenous Every 8 hours 04/28/15 1420     04/28/15 1330  Ampicillin-Sulbactam (UNASYN) 3 g in sodium chloride 0.9 % 100 mL IVPB     3 g 100 mL/hr over 60 Minutes Intravenous Every 6 hours 04/28/15 1327     04/28/15 1130  vancomycin (VANCOCIN) IVPB 1000 mg/200 mL premix     1,000 mg 200 mL/hr over 60 Minutes Intravenous  Once 04/28/15 1117 04/28/15 1445   04/28/15 1130  Ampicillin-Sulbactam (UNASYN) 3 g in sodium chloride 0.9 % 100 mL IVPB     3 g 100 mL/hr over 60 Minutes Intravenous  Once 04/28/15 1117 04/28/15 1226      Current Facility-Administered Medications  Medication Dose Route Frequency Provider Last Rate Last Dose  . 0.9 %  sodium chloride infusion   Intravenous Continuous Marguarite Arbour, MD 100 mL/hr at 05/01/15 0402    . acetaminophen (TYLENOL) tablet 650 mg  650 mg Oral Q6H PRN Marguarite Arbour, MD       Or  . acetaminophen (TYLENOL) suppository 650 mg  650 mg Rectal Q6H PRN Marguarite Arbour, MD      . Ampicillin-Sulbactam (UNASYN) 3 g in sodium chloride 0.9 % 100 mL IVPB  3 g Intravenous Q6H Marguarite Arbour, MD   3 g at 05/01/15 0816  . aspirin EC tablet 81 mg  81 mg Oral Daily Marguarite Arbour, MD   81 mg at 05/01/15 1025  . bisacodyl (DULCOLAX) suppository 10 mg  10 mg Rectal Daily PRN Marguarite Arbour, MD      . diphenhydrAMINE (BENADRYL) capsule 25 mg  25 mg Oral Q4H PRN Wyatt Haste,  MD   25 mg at 04/30/15 0049  . docusate sodium (COLACE) capsule 100 mg  100 mg Oral BID Marguarite Arbour, MD   100 mg at 05/01/15 1024  . heparin injection 5,000 Units  5,000 Units Subcutaneous 3 times per day Marguarite Arbour, MD   5,000 Units at 05/01/15 0615  . HYDROcodone-acetaminophen (NORCO/VICODIN) 5-325 MG per tablet 1-2 tablet  1-2 tablet Oral Q4H PRN Marguarite Arbour, MD   2 tablet at 05/01/15 1408  . morphine 2 MG/ML injection 2 mg  2 mg Intravenous Q2H PRN Marguarite Arbour, MD   2 mg at 04/30/15 2337  . nicotine (NICODERM CQ - dosed in mg/24 hours) patch 14 mg  14 mg Transdermal Daily Shaune Pollack, MD   14 mg at 04/30/15 1029  . ondansetron (ZOFRAN) tablet 4 mg  4 mg Oral Q6H PRN Marguarite Arbour, MD       Or  . ondansetron [facility]) injection 4 mg  4 mg Intravenous Q6H PRN Marguarite Arbour, MD   4 mg at 04/30/15 1302  . pantoprazole (PROTONIX) EC tablet 40 mg  40 mg Oral Daily Marguarite Arbour, MD   40  mg at 05/01/15 1025  . vancomycin (VANCOCIN) IVPB 1000 mg/200 mL premix  1,000 mg Intravenous Q8H Enid Baasadhika Kalisetti, MD   1,000 mg at 05/01/15 0816     Objective: Vital signs in last 24 hours: Temp:  [98.3 F (36.8 C)-99.2 F (37.3 C)] 98.3 F (36.8 C) (07/20 1511) Pulse Rate:  [70-80] 70 (07/20 1511) Resp:  [16] 16 (07/20 1511) BP: (138-150)/(68-92) 150/68 mmHg (07/20 1511) SpO2:  [100 %] 100 % (07/20 1511) Weight:  [225 lb 9.6 oz (102.331 kg)] 225 lb 9.6 oz (102.331 kg) (07/20 0106)  Intake/Output from previous day: 07/19 0701 - 07/20 0700 In: 3154 [P.O.:240; I.V.:1889; IV Piggyback:1025] Out: 1200 [Urine:1200] Intake/Output this shift: Total I/O In: 810 [P.O.:480; I.V.:330] Out: 1401 [Urine:1400; Stool:1]   Physical Exam  Constitutional: He is oriented to person, place, and time and well-developed, well-nourished, and in no distress.  HENT:  Head: Normocephalic and atraumatic.  Cardiovascular: Normal rate.   Pulmonary/Chest: Effort normal and breath sounds normal.   Abdominal: Soft. Bowel sounds are normal.  Genitourinary:  Improved penoscrotal edema. Perineal wound clean dry and intact, no further purulence.  Wound repacked today with a clean wet-to-dry dressing. Patient required premedication with 2 mg of Dilaudid and 1 mg of Ativan.  Musculoskeletal: Normal range of motion.  Neurological: He is alert and oriented to person, place, and time.  Skin: Skin is warm and dry.  Psychiatric:  Somewhat agitated    Lab Results:   Recent Labs  04/30/15 0010 05/01/15 0413  WBC 15.6* 10.1  HGB 11.2* 11.3*  HCT 33.7* 34.1*  PLT 231 287   BMET  Recent Labs  04/29/15 0601 04/30/15 0010  NA 136 139  K 3.4* 3.7  CL 105 110  CO2 24 21*  GLUCOSE 128* 125*  BUN 8 10  CREATININE 1.18 1.17  CALCIUM 8.1* 8.0*    Assessment: s/p Procedure(s) POD1: INCISION AND DRAINAGE ABSCESS/PERINEAL ABSCESS  Plan: 1. Recommend daily wet-to-dry wound packing- patient not eligible for home health, discuss case with case management. Bedside teaching performed today with brother who is willing to help with dressing changes. 2. Agree with transition to by mouth clinic upon discharge 3. Plan [facility].   LOS: 3 days    Johnny Gonzales 05/01/2015   I spent 25 min with this patient of which greater than 50% was spent in counseling and coordination of care with the patient.

## 2015-05-03 ENCOUNTER — Ambulatory Visit: Payer: Self-pay | Admitting: Urology

## 2015-05-03 LAB — WOUND CULTURE

## 2015-05-03 LAB — CULTURE, BLOOD (ROUTINE X 2)
CULTURE: NO GROWTH
CULTURE: NO GROWTH

## 2015-05-04 LAB — ANAEROBIC CULTURE

## 2015-05-17 ENCOUNTER — Encounter: Payer: Self-pay | Admitting: Urology

## 2015-05-17 ENCOUNTER — Ambulatory Visit (INDEPENDENT_AMBULATORY_CARE_PROVIDER_SITE_OTHER): Payer: Self-pay | Admitting: Urology

## 2015-05-17 VITALS — BP 136/84 | HR 86 | Ht 72.0 in | Wt 230.8 lb

## 2015-05-17 DIAGNOSIS — L02215 Cutaneous abscess of perineum: Secondary | ICD-10-CM | POA: Insufficient documentation

## 2015-05-17 NOTE — Progress Notes (Signed)
05/17/2015 5:34 PM   [name] [birth year] [id number]  Referring provider: No referring provider defined for this encounter.  Chief Complaint  Patient presents with  . Wound Check    HPI: 54 year old male who presents today for follow-up after admission for penoscrotal cellulitis which evolved into a perineal abscess requiring I&D on 05/01/2015. He remained hospitalized for IV antibiotics for several days and was ultimately discharged home on a 10 day course of clindamycin. His wound cultures grew staph aureus.  Initially, his brother was helping him pack his scrotal wound, but over the past week or so, he has been unable to pack this incision.  Overall, he thinks that the areas healing well. He denies any further drainage. His penoscrotal swelling has resolved. He is anxious to return back to work.   PMH: Past Medical History  Diagnosis Date  . Back pain     Surgical History: Past Surgical History  Procedure Laterality Date  . Hand surgery    . Incision and drainage abscess N/A 04/30/2015    Procedure: INCISION AND DRAINAGE ABSCESS/PERINEAL ABSCESS;  Surgeon: Vanna Scotland, MD;  Location: [facility] ORS;  Service: Urology;  Laterality: N/A;    Home Medications:    Medication List       This list is accurate as of: 05/17/15  5:34 PM.  Always use your most recent med list.               BC HEADACHE POWDER PO  Take 1 packet by mouth daily as needed (pain and stiffness.).        Allergies: No Known Allergies  Family History: Family History  Problem Relation Age of Onset  . Bladder Cancer Neg Hx   . Prostate cancer Neg Hx   . Kidney cancer Neg Hx   . Stroke Father   . Heart attack Brother   . Heart attack Mother     Social History:  reports that he has been smoking.  He does not have any smokeless tobacco history on file. He reports that he drinks alcohol. He reports that he uses illicit drugs (Marijuana).  Physical Exam: BP 136/84 mmHg  Pulse 86  Ht 6'  (1.829 m)  Wt 230 lb 12.8 oz (104.69 kg)  BMI 31.30 kg/m2  Constitutional:  Alert and oriented, No acute distress. HEENT: [place] AT, moist mucus membranes.  Trachea midline, no masses. Cardiovascular: No clubbing, cyanosis, or edema. Respiratory: Normal respiratory effort, no increased work of breathing. GI: Abdomen is soft, nontender, nondistended, no abdominal masses GU: No CVA tenderness. No penoscrotal edema or cellulitis notable. Perineal wound remains slightly opened, approximately 3 cm in length, with a rolled over granulation tissue at the wound edges but no deep wound or defect. No evidence of residual abscess. Surrounding skin is normal. Skin: No rashes, bruises or suspicious lesions. Lymph: No cervical or inguinal adenopathy. Neurologic: Grossly intact, no focal deficits, moving all 4 extremities. Psychiatric: Normal mood and affect.  Laboratory Data: Lab Results  Component Value Date   WBC 10.1 05/01/2015   HGB 11.3* 05/01/2015   HCT 34.1* 05/01/2015   MCV 83.4 05/01/2015   PLT 287 05/01/2015    Lab Results  Component Value Date   CREATININE 1.14 05/01/2015     Assessment & Plan:  54 year old male with penoscrotal cellulitis involving into perineal abscess status post I&D on 7/20 who returns today for wound check. He has completed his antibiotics course and the incision looks quite good today. No further packing required.  Patient was counseled on warning symptoms and advised is now safe to return to work. All of his questions were answered.  1. Perineal abscess Resolved.  All residual perineal wound longer requiring packing, no evidence of residual infection. -return to work note provideded  Return if symptoms worsen or fail to improve.  Vanna Scotland, MD  [facility] [address] [place], [place] [address] [phone]

## 2015-05-19 ENCOUNTER — Encounter: Payer: Self-pay | Admitting: Urology

## 2015-05-22 ENCOUNTER — Ambulatory Visit: Payer: Self-pay

## 2016-03-15 ENCOUNTER — Emergency Department
Admission: EM | Admit: 2016-03-15 | Discharge: 2016-03-15 | Disposition: A | Discharge status: Home / Self Care | Payer: Self-pay | Attending: Student | Admitting: Student

## 2016-03-15 ENCOUNTER — Emergency Department: Payer: Self-pay

## 2016-03-15 ENCOUNTER — Encounter: Payer: Self-pay | Admitting: Medical Oncology

## 2016-03-15 DIAGNOSIS — F1290 Cannabis use, unspecified, uncomplicated: Secondary | ICD-10-CM | POA: Insufficient documentation

## 2016-03-15 DIAGNOSIS — Z7982 Long term (current) use of aspirin: Secondary | ICD-10-CM | POA: Insufficient documentation

## 2016-03-15 DIAGNOSIS — F17200 Nicotine dependence, unspecified, uncomplicated: Secondary | ICD-10-CM | POA: Insufficient documentation

## 2016-03-15 DIAGNOSIS — W228XXA Striking against or struck by other objects, initial encounter: Secondary | ICD-10-CM | POA: Insufficient documentation

## 2016-03-15 DIAGNOSIS — S8011XA Contusion of right lower leg, initial encounter: Secondary | ICD-10-CM | POA: Insufficient documentation

## 2016-03-15 DIAGNOSIS — Y999 Unspecified external cause status: Secondary | ICD-10-CM | POA: Insufficient documentation

## 2016-03-15 DIAGNOSIS — S39012A Strain of muscle, fascia and tendon of lower back, initial encounter: Secondary | ICD-10-CM | POA: Insufficient documentation

## 2016-03-15 DIAGNOSIS — Y939 Activity, unspecified: Secondary | ICD-10-CM | POA: Insufficient documentation

## 2016-03-15 DIAGNOSIS — Y929 Unspecified place or not applicable: Secondary | ICD-10-CM | POA: Insufficient documentation

## 2016-03-15 MED ORDER — KETOROLAC TROMETHAMINE 60 MG/2ML IM SOLN
30.0000 mg | Freq: Once | INTRAMUSCULAR | Status: AC
  Administered 2016-03-15: 30 mg via INTRAMUSCULAR
  Filled 2016-03-15: qty 2

## 2016-03-15 MED ORDER — CYCLOBENZAPRINE HCL 5 MG PO TABS: 5.0000 mg | ORAL_TABLET | Freq: Three times a day (TID) | ORAL | Status: DC | PRN

## 2016-03-15 MED ORDER — KETOROLAC TROMETHAMINE 10 MG PO TABS: 10.0000 mg | ORAL_TABLET | Freq: Three times a day (TID) | ORAL | Status: DC

## 2016-03-15 MED ORDER — TRAMADOL HCL 50 MG PO TABS: 50.0000 mg | ORAL_TABLET | Freq: Two times a day (BID) | ORAL | Status: DC

## 2016-03-15 NOTE — ED Notes (Signed)
Pt here via ems with reports of rt leg pain/ pain to lower back that began about 2 weeks ago after kicking something. Hx of chronic back issues.

## 2016-03-15 NOTE — Discharge Instructions (Signed)
Contusion A contusion is a deep bruise. Contusions happen when an injury causes bleeding under the skin. Symptoms of bruising include pain, swelling, and discolored skin. The skin may turn blue, purple, or yellow. HOME CARE   Rest the injured area.  If told, put ice on the injured area.  Put ice in a plastic bag.  Place a towel between your skin and the bag.  Leave the ice on for 20 minutes, 2-3 times per day.  If told, put light pressure (compression) on the injured area using an elastic bandage. Make sure the bandage is not too tight. Remove it and put it back on as told by your doctor.  If possible, raise (elevate) the injured area above the level of your heart while you are sitting or lying down.  Take over-the-counter and prescription medicines only as told by your doctor. GET HELP IF:  Your symptoms do not get better after several days of treatment.  Your symptoms get worse.  You have trouble moving the injured area. GET HELP RIGHT AWAY IF:   You have very bad pain.  You have a loss of feeling (numbness) in a hand or foot.  Your hand or foot turns pale or cold.   This information is not intended to replace advice given to you by your health care provider. Make sure you discuss any questions you have with your health care provider.   Document Released: 03/16/2008 Document Revised: 06/19/2015 Document Reviewed: 02/13/2015 Elsevier Interactive Patient Education 2016 Elsevier Inc.  Lumbosacral Strain Lumbosacral strain is a strain of any of the parts that make up your lumbosacral vertebrae. Your lumbosacral vertebrae are the bones that make up the lower third of your backbone. Your lumbosacral vertebrae are held together by muscles and tough, fibrous tissue (ligaments).  CAUSES  A sudden blow to your back can cause lumbosacral strain. Also, anything that causes an excessive stretch of the muscles in the low back can cause this strain. This is typically seen when people  exert themselves strenuously, fall, lift heavy objects, bend, or crouch repeatedly. RISK FACTORS  Physically demanding work.  Participation in pushing or pulling sports or sports that require a sudden twist of the back (tennis, golf, baseball).  Weight lifting.  Excessive lower back curvature.  Forward-tilted pelvis.  Weak back or abdominal muscles or both.  Tight hamstrings. SIGNS AND SYMPTOMS  Lumbosacral strain may cause pain in the area of your injury or pain that moves (radiates) down your leg.  DIAGNOSIS Your health care provider can often diagnose lumbosacral strain through a physical exam. In some cases, you may need tests such as X-ray exams.  TREATMENT  Treatment for your lower back injury depends on many factors that your clinician will have to evaluate. However, most treatment will include the use of anti-inflammatory medicines. HOME CARE INSTRUCTIONS   Avoid hard physical activities (tennis, racquetball, waterskiing) if you are not in proper physical condition for it. This may aggravate or create problems.  If you have a back problem, avoid sports requiring sudden body movements. Swimming and walking are generally safer activities.  Maintain good posture.  Maintain a healthy weight.  For acute conditions, you may put ice on the injured area.  Put ice in a plastic bag.  Place a towel between your skin and the bag.  Leave the ice on for 20 minutes, 2-3 times a day.  When the low back starts healing, stretching and strengthening exercises may be recommended. SEEK MEDICAL CARE IF:  Your  back pain is getting worse.  You experience severe back pain not relieved with medicines. SEEK IMMEDIATE MEDICAL CARE IF:   You have numbness, tingling, weakness, or problems with the use of your arms or legs.  There is a change in bowel or bladder control.  You have increasing pain in any area of the body, including your belly (abdomen).  You notice shortness of breath,  dizziness, or feel faint.  You feel sick to your stomach (nauseous), are throwing up (vomiting), or become sweaty.  You notice discoloration of your toes or legs, or your feet get very cold. MAKE SURE YOU:   Understand these instructions.  Will watch your condition.  Will get help right away if you are not doing well or get worse.   This information is not intended to replace advice given to you by your health care provider. Make sure you discuss any questions you have with your health care provider.   Document Released: 07/08/2005 Document Revised: 10/19/2014 Document Reviewed: 05/17/2013 Elsevier Interactive Patient Education [employer] Elsevier Inc.  Take the prescription meds as directed. Apply moist heat to the back and ice to lower leg for pain and swelling. [facility].

## 2016-03-15 NOTE — ED Notes (Signed)
Pt did not let nurse get VS or go over paperwork, states he was in a hurry and would read it himself

## 2016-03-15 NOTE — ED Notes (Signed)
States he hit his right lower leg about 3 weeks ago  conts to have pain to same but staes pian radiates into lower back  Ambulates with slight limp d/t pain

## 2016-03-15 NOTE — ED Provider Notes (Signed)
[facility] Emergency Department Provider Note ____________________________________________  Time seen: 1306  I have reviewed the triage vital signs and the nursing notes.  HISTORY  Chief Complaint  Back Pain and Leg Pain  HPI Johnny Gonzales is a 55 y.o. male presents to the ED, via EMS, for evaluation of continued pain to his right lower leg as well as low back pain. He describes he was actually hit in the right lower leg by the door of a small refrigerator 3 weeks prior. He did notseek treatment or evaluation at the time of the injury. He reports he has significant swelling to the lower leg and pain which caused some have a limp. He describes that about 3 days ago he developed pain to the lower back on the right side. He speculates that his leg injuries related to his back injury. He does however his leg injury and the swelling has resolved. He denied any previous laceration, abrasion, or skin injury related to the contusion. He has taken ibuprofen intermittently for relief of his back pain but denies any significant benefit. He denies any distal paresthesias, foot drop, bladder or bowel incontinence. He rates his overall discomfort at an 8/10 in triage. He does give a history of chronic back pain but denies any current medicine management.  Past Medical History  Diagnosis Date  . Back pain     Patient Active Problem List   Diagnosis Date Noted  . Perineal abscess 05/17/2015    Past Surgical History  Procedure Laterality Date  . Hand surgery    . Incision and drainage abscess N/A 04/30/2015    Procedure: INCISION AND DRAINAGE ABSCESS/PERINEAL ABSCESS;  Surgeon: Vanna ScotlandAshley Brandon, MD;  Location: [facility] ORS;  Service: Urology;  Laterality: N/A;    Current Outpatient Rx  Name  Route  Sig  Dispense  Refill  . Aspirin-Salicylamide-Caffeine (BC HEADACHE POWDER PO)   Oral   Take 1 packet by mouth daily as needed (pain and stiffness.).         Marland Kitchen. cyclobenzaprine  (FLEXERIL) 5 MG tablet   Oral   Take 1 tablet (5 mg total) by mouth every 8 (eight) hours as needed for muscle spasms.   12 tablet   0   . ketorolac (TORADOL) 10 MG tablet   Oral   Take 1 tablet (10 mg total) by mouth every 8 (eight) hours.   15 tablet   0   . traMADol (ULTRAM) 50 MG tablet   Oral   Take 1 tablet (50 mg total) by mouth 2 (two) times daily.   10 tablet   0    Allergies Review of patient's allergies indicates no known allergies.  Family History  Problem Relation Age of Onset  . Bladder Cancer Neg Hx   . Prostate cancer Neg Hx   . Kidney cancer Neg Hx   . Stroke Father   . Heart attack Brother   . Heart attack Mother    Social History Social History  Substance Use Topics  . Smoking status: Current Every Day Smoker -- 2.00 packs/day  . Smokeless tobacco: None  . Alcohol Use: Yes   Review of Systems  Constitutional: Negative for fever. Gastrointestinal: Negative for abdominal pain, vomiting and diarrhea. Genitourinary: Negative for dysuria. Musculoskeletal: Positive for back pain. Right leg pain. Skin: Negative for rash. Neurological: Negative for headaches, focal weakness or numbness. ____________________________________________  PHYSICAL EXAM:  VITAL SIGNS: ED Triage Vitals  Enc Vitals Group     BP 03/15/16 1123  132/63 mmHg     Pulse Rate 03/15/16 1123 80     Resp 03/15/16 1123 20     Temp 03/15/16 1123 98.8 F (37.1 C)     Temp Source 03/15/16 1123 Oral     SpO2 03/15/16 1123 99 %     Weight 03/15/16 1123 220 lb (99.791 kg)     Height 03/15/16 1123 6' (1.829 m)     Head Cir --      Peak Flow --      Pain Score 03/15/16 1124 8     Pain Loc --      Pain Edu? --      Excl. in GC? --    Constitutional: Alert and oriented. Well appearing and in no distress. Head: Normocephalic and atraumatic. Cardiovascular: Normal rate, regular rhythm. Normal distal pulses. Respiratory: Normal respiratory effort. No  wheezes/rales/rhonchi. Gastrointestinal: Soft and nontender. No distention. No flank tenderness Musculoskeletal: Normal spinal alignment without midline tenderness, spasm, deformity, or step-off. Patient transitions from supine to sit without assistance. He is able to demonstrate a negative seated and supine straight leg raise. His right leg is without any obvious deformity, edema, hematoma, abrasion, or laceration. Patient is able to actively raise his right leg and externally rotate his hip to place his foot on his lap to remove his shoe, without assistance. Or Achilles tenderness is appreciated on the right lower extremities. Nontender with normal range of motion in all extremities.  Neurologic: Cranial nerves II through XII grossly intact. Normal LE DTRs bilaterally. Mildly gait without ataxia. Normal speech and language. No gross focal neurologic deficits are appreciated. Skin:  Skin is warm, dry and intact. No rash noted. ____________________________________________   RADIOLOGY  Right TIb/Fib  IMPRESSION: No acute bony abnormality.  I, Shadman Tozzi, Charlesetta Ivory, personally viewed and evaluated these images (plain radiographs) as part of my medical decision making, as well as reviewing the written report by the radiologist. ____________________________________________  PROCEDURES  Toradol 30 mg IM ____________________________________________  INITIAL IMPRESSION / ASSESSMENT AND PLAN / ED COURSE  Patient with what appears to be a right leg contusion without evidence of an acute or chronic hematoma. He also has what I feel is unrelated lumbar strain on the right. His exam is otherwise unremarkable no indication of any acute neuromuscular deficit or discogenic etiology. He'll be discharged with a prescription for Toradol, Flexeril, and Ultram. He will follow-up with Dr. Deeann Saint or one of the local community clinics for ongoing symptom  management. ____________________________________________  FINAL CLINICAL IMPRESSION(S) / ED DIAGNOSES  Final diagnoses:  Lumbar strain, initial encounter  Contusion of leg, right, initial encounter     Lissa Hoard, PA-C 03/15/16 1624  Gayla Doss, MD 03/15/16 [phone]

## 2016-03-16 ENCOUNTER — Encounter: Payer: Self-pay | Admitting: Emergency Medicine

## 2016-03-16 ENCOUNTER — Emergency Department: Payer: Self-pay

## 2016-03-16 ENCOUNTER — Emergency Department
Admission: EM | Admit: 2016-03-16 | Discharge: 2016-03-16 | Disposition: A | Discharge status: Home / Self Care | Payer: Self-pay | Attending: Emergency Medicine | Admitting: Emergency Medicine

## 2016-03-16 DIAGNOSIS — F1290 Cannabis use, unspecified, uncomplicated: Secondary | ICD-10-CM | POA: Insufficient documentation

## 2016-03-16 DIAGNOSIS — M545 Low back pain, unspecified: Secondary | ICD-10-CM

## 2016-03-16 DIAGNOSIS — M5136 Other intervertebral disc degeneration, lumbar region: Secondary | ICD-10-CM | POA: Insufficient documentation

## 2016-03-16 DIAGNOSIS — F17200 Nicotine dependence, unspecified, uncomplicated: Secondary | ICD-10-CM | POA: Insufficient documentation

## 2016-03-16 DIAGNOSIS — M5137 Other intervertebral disc degeneration, lumbosacral region: Secondary | ICD-10-CM

## 2016-03-16 LAB — URINE DRUG SCREEN, QUALITATIVE (ARMC ONLY)
Amphetamines, Ur Screen: NOT DETECTED
Barbiturates, Ur Screen: NOT DETECTED
Benzodiazepine, Ur Scrn: NOT DETECTED
Cannabinoid 50 Ng, Ur ~~LOC~~: NOT DETECTED
Cocaine Metabolite,Ur ~~LOC~~: POSITIVE — AB
MDMA (ECSTASY) UR SCREEN: NOT DETECTED
Methadone Scn, Ur: NOT DETECTED
Opiate, Ur Screen: NOT DETECTED
PHENCYCLIDINE (PCP) UR S: NOT DETECTED
Tricyclic, Ur Screen: NOT DETECTED

## 2016-03-16 LAB — URINALYSIS COMPLETE WITH MICROSCOPIC (ARMC ONLY)
Bacteria, UA: NONE SEEN
Bilirubin Urine: NEGATIVE
Glucose, UA: NEGATIVE mg/dL
KETONES UR: NEGATIVE mg/dL
LEUKOCYTES UA: NEGATIVE
Nitrite: NEGATIVE
PROTEIN: NEGATIVE mg/dL
SPECIFIC GRAVITY, URINE: 1.008 (ref 1.005–1.030)
SQUAMOUS EPITHELIAL / LPF: NONE SEEN
pH: 7 (ref 5.0–8.0)

## 2016-03-16 MED ORDER — CYCLOBENZAPRINE HCL 10 MG PO TABS
5.0000 mg | ORAL_TABLET | Freq: Once | ORAL | Status: AC
  Administered 2016-03-16: 5 mg via ORAL
  Filled 2016-03-16: qty 1

## 2016-03-16 MED ORDER — KETOROLAC TROMETHAMINE 60 MG/2ML IM SOLN
30.0000 mg | Freq: Once | INTRAMUSCULAR | Status: AC
  Administered 2016-03-16: 30 mg via INTRAMUSCULAR
  Filled 2016-03-16: qty 2

## 2016-03-16 MED ORDER — HYDROCODONE-ACETAMINOPHEN 5-325 MG PO TABS
1.0000 | ORAL_TABLET | Freq: Once | ORAL | Status: AC
  Administered 2016-03-16: 1 via ORAL
  Filled 2016-03-16: qty 1

## 2016-03-16 MED ORDER — DIAZEPAM 5 MG/ML IJ SOLN: 5.0000 mg | Freq: Once | INTRAMUSCULAR | Status: DC

## 2016-03-16 NOTE — Discharge Instructions (Signed)
Back Pain, Adult °Back pain is very common in adults. The cause of back pain is rarely dangerous and the pain often gets better over time. The cause of your back pain may not be known. Some common causes of back pain include: °· Strain of the muscles or ligaments supporting the spine. °· Wear and tear (degeneration) of the spinal disks. °· Arthritis. °· Direct injury to the back. °For many people, back pain may return. Since back pain is rarely dangerous, most people can learn to manage this condition on their own. °HOME CARE INSTRUCTIONS °Watch your back pain for any changes. The following actions may help to lessen any discomfort you are feeling: °· Remain active. It is stressful on your back to sit or stand in one place for long periods of time. Do not sit, drive, or stand in one place for more than 30 minutes at a time. Take short walks on even surfaces as soon as you are able. Try to increase the length of time you walk each day. °· Exercise regularly as directed by your health care provider. Exercise helps your back heal faster. It also helps avoid future injury by keeping your muscles strong and flexible. °· Do not stay in bed. Resting more than 1-2 days can delay your recovery. °· Pay attention to your body when you bend and lift. The most comfortable positions are those that put less stress on your recovering back. Always use proper lifting techniques, including: °· Bending your knees. °· Keeping the load close to your body. °· Avoiding twisting. °· Find a comfortable position to sleep. Use a firm mattress and lie on your side with your knees slightly bent. If you lie on your back, put a pillow under your knees. °· Avoid feeling anxious or stressed. Stress increases muscle tension and can worsen back pain. It is important to recognize when you are anxious or stressed and learn ways to manage it, such as with exercise. °· Take medicines only as directed by your health care provider. Over-the-counter  medicines to reduce pain and inflammation are often the most helpful. Your health care provider may prescribe muscle relaxant drugs. These medicines help dull your pain so you can more quickly return to your normal activities and healthy exercise. °· Apply ice to the injured area: °· Put ice in a plastic bag. °· Place a towel between your skin and the bag. °· Leave the ice on for 20 minutes, 2-3 times a day for the first 2-3 days. After that, ice and heat may be alternated to reduce pain and spasms. °· Maintain a healthy weight. Excess weight puts extra stress on your back and makes it difficult to maintain good posture. °SEEK MEDICAL CARE IF: °· You have pain that is not relieved with rest or medicine. °· You have increasing pain going down into the legs or buttocks. °· You have pain that does not improve in one week. °· You have night pain. °· You lose weight. °· You have a fever or chills. °SEEK IMMEDIATE MEDICAL CARE IF:  °· You develop new bowel or bladder control problems. °· You have unusual weakness or numbness in your arms or legs. °· You develop nausea or vomiting. °· You develop abdominal pain. °· You feel faint. °  °This information is not intended to replace advice given to you by your health care provider. Make sure you discuss any questions you have with your health care provider. °  °Document Released: 09/28/2005 Document Revised: 10/19/2014 Document Reviewed: 01/30/2014 °Elsevier Interactive Patient Education ©2016 Elsevier   Inc.  Degenerative Disk Disease Degenerative disk disease is a condition caused by the changes that occur in spinal disks as you grow older. Spinal disks are soft and compressible disks located between the bones of your spine (vertebrae). These disks act like shock absorbers. Degenerative disk disease can affect the whole spine. However, the neck and lower back are most commonly affected. Many changes can occur in the spinal disks with aging, such as:  The spinal disks may  dry and shrink.  Small tears may occur in the tough, outer covering of the disk (annulus).  The disk space may become smaller due to loss of water.  Abnormal growths in the bone (spurs) may occur. This can put pressure on the nerve roots exiting the spinal canal, causing pain.  The spinal canal may become narrowed. RISK FACTORS   Being overweight.  Having a family history of degenerative disk disease.  Smoking.  There is increased risk if you are doing heavy lifting or have a sudden injury. SIGNS AND SYMPTOMS  Symptoms vary from person to person and may include:  Pain that varies in intensity. Some people have no pain, while others have severe pain. The location of the pain depends on the part of your backbone that is affected.  You will have neck or arm pain if a disk in the neck area is affected.  You will have pain in your back, buttocks, or legs if a disk in the lower back is affected.  Pain that becomes worse while bending, reaching up, or with twisting movements.  Pain that may start gradually and then get worse as time passes. It may also start after a major or minor injury.  Numbness or tingling in the arms or legs. DIAGNOSIS  Your health care provider will ask you about your symptoms and about activities or habits that may cause the pain. He or she may also ask about any injuries, diseases, or treatments you have had. Your health care provider will examine you to check for the range of movement that is possible in the affected area, to check for strength in your extremities, and to check for sensation in the areas of the arms and legs supplied by different nerve roots. You may also have:   An X-ray of the spine.  Other imaging tests, such as MRI. TREATMENT  Your health care provider will advise you on the best plan for treatment. Treatment may include:  Medicines.  Rehabilitation exercises. HOME CARE INSTRUCTIONS   Follow proper lifting and walking techniques as  advised by your health care provider.  Maintain good posture.  Exercise regularly as advised by your health care provider.  Perform relaxation exercises.  Change your sitting, standing, and sleeping habits as advised by your health care provider.  Change positions frequently.  Lose weight or maintain a healthy weight as advised by your health care provider.  Do not use any tobacco products, including cigarettes, chewing tobacco, or electronic cigarettes. If you need help quitting, ask your health care provider.  Wear supportive footwear.  Take medicines only as directed by your health care provider. SEEK MEDICAL CARE IF:   Your pain does not go away within 1-4 weeks.  You have significant appetite or weight loss. SEEK IMMEDIATE MEDICAL CARE IF:   Your pain is severe.  You notice weakness in your arms, hands, or legs.  You begin to lose control of your bladder or bowel movements.  You have fevers or night sweats. MAKE SURE YOU:  Understand these instructions.  Will watch your condition.  Will get help right away if you are not doing well or get worse.   This information is not intended to replace advice given to you by your health care provider. Make sure you discuss any questions you have with your health care provider.   Document Released: 07/26/2007 Document Revised: 10/19/2014 Document Reviewed: 01/30/2014 Elsevier Interactive Patient Education 2016 [employer]  Take the previously prescribed pain and muscle relaxant meds as directed. Follow-up with Dr. Hyacinth MeekerMiller or your provider or a local community clinic as needed.

## 2016-03-16 NOTE — ED Notes (Signed)
Pt. Verbalizes understanding of d/c instructions and follow-up care. Pt. In NAD at time of d/c. VS stable. Pt. Ambulatory out of the unit with steady gait.

## 2016-03-16 NOTE — ED Notes (Signed)
Brought in via ems from home with lower back pain  Was seen yesterday for same was unable to get rx's filled yesterday ..states pain is worse

## 2016-03-16 NOTE — ED Provider Notes (Signed)
[facility] Emergency Department Provider Note ____________________________________________  Time seen: 1057  I have reviewed the triage vital signs and the nursing notes.  HISTORY  Chief Complaint  Back Pain  HPI Johnny Gonzales is a 55 y.o. male presents again to the ED via EMS for complaints of continued low back pain.He was evaluated by me yesterday and presents today to my nurse with his discharge instructions and previous prescription still stapled together. He claims he was unable to get his prescription filled due to financial constraints. As such, he complains of pain that is unchanged to his lower back. He denies any interim injury, accident, trauma since evaluation yesterday. He has no lower extremity paresthesias, foot drop, or incontinence. He rates his pain at a 10/10 in triage.  Past Medical History  Diagnosis Date  . Back pain     Patient Active Problem List   Diagnosis Date Noted  . Perineal abscess 05/17/2015    Past Surgical History  Procedure Laterality Date  . Hand surgery    . Incision and drainage abscess N/A 04/30/2015    Procedure: INCISION AND DRAINAGE ABSCESS/PERINEAL ABSCESS;  Surgeon: Vanna ScotlandAshley Brandon, MD;  Location: [facility] ORS;  Service: Urology;  Laterality: N/A;    Current Outpatient Rx  Name  Route  Sig  Dispense  Refill  . Aspirin-Salicylamide-Caffeine (BC HEADACHE POWDER PO)   Oral   Take 1 packet by mouth daily as needed (pain and stiffness.).         Marland Kitchen. cyclobenzaprine (FLEXERIL) 5 MG tablet   Oral   Take 1 tablet (5 mg total) by mouth every 8 (eight) hours as needed for muscle spasms.   12 tablet   0   . ketorolac (TORADOL) 10 MG tablet   Oral   Take 1 tablet (10 mg total) by mouth every 8 (eight) hours.   15 tablet   0   . traMADol (ULTRAM) 50 MG tablet   Oral   Take 1 tablet (50 mg total) by mouth 2 (two) times daily.   10 tablet   0    Allergies Review of patient's allergies indicates no known  allergies.  Family History  Problem Relation Age of Onset  . Bladder Cancer Neg Hx   . Prostate cancer Neg Hx   . Kidney cancer Neg Hx   . Stroke Father   . Heart attack Brother   . Heart attack Mother     Social History Social History  Substance Use Topics  . Smoking status: Current Every Day Smoker -- 2.00 packs/day  . Smokeless tobacco: None  . Alcohol Use: Yes    Review of Systems  Constitutional: Negative for fever. Cardiovascular: Negative for chest pain. Respiratory: Negative for shortness of breath. Gastrointestinal: Negative for abdominal pain, vomiting and diarrhea. Genitourinary: Negative for dysuria. Musculoskeletal: Positive for back pain. Skin: Negative for rash. Neurological: Negative for headaches, focal weakness or numbness. ____________________________________________  PHYSICAL EXAM:  VITAL SIGNS: ED Triage Vitals  Enc Vitals Group     BP 03/16/16 1011 118/62 mmHg     Pulse Rate 03/16/16 1011 69     Resp 03/16/16 1011 20     Temp 03/16/16 1011 97.6 F (36.4 C)     Temp Source 03/16/16 1011 Oral     SpO2 03/16/16 1011 97 %     Weight 03/16/16 1011 220 lb (99.791 kg)     Height 03/16/16 1011 6' (1.829 m)     Head Cir --  Peak Flow --      Pain Score 03/16/16 1012 10     Pain Loc --      Pain Edu? --      Excl. in GC? --    Constitutional: Alert and oriented. Well appearing and in no distress. Head: Normocephalic and atraumatic. Cardiovascular: Normal rate, regular rhythm.  Respiratory: Normal respiratory effort. No wheezes/rales/rhonchi. Gastrointestinal: Soft and nontender. No distention. Musculoskeletal: Nontender with normal range of motion in all extremities.  Neurologic:  Normal gait without ataxia. Normal speech and language. No gross focal neurologic deficits are appreciated. Skin:  Skin is warm, dry and intact. No rash noted. ____________________________________________   LABS (pertinent positives/negatives) Labs Reviewed   URINALYSIS COMPLETEWITH MICROSCOPIC ([facility] ONLY) - Abnormal; Notable for the following:    Color, Urine STRAW (*)    APPearance CLEAR (*)    Hgb urine dipstick 1+ (*)    All other components within normal limits  URINE DRUG SCREEN, QUALITATIVE ([facility] ONLY) - Abnormal; Notable for the following:    Cocaine Metabolite,Ur Watson POSITIVE (*)    All other components within normal limits  ____________________________________________   RADIOLOGY  LS Spine IMPRESSION: 1. No lumbar spine fracture or spondylolisthesis. 2. Mild-to-moderate degenerative changes in the lumbar spine as described.  I, Zipporah Finamore, Charlesetta Ivory, personally viewed and evaluated these images (plain radiographs) as part of my medical decision making, as well as reviewing the written report by the radiologist. ____________________________________________  PROCEDURES  Toradol  IM Flexeril 5 mg PO Norco 5-325 mg PO ____________________________________________  INITIAL IMPRESSION / ASSESSMENT AND PLAN / ED COURSE  Patient with acute on chronic flare of his low back pain without radiologic evidence of underlying fracture or dislocation. He has significant degenerative disc disease. He will again be discharged with instructions to dose the previously prescribed medications as directed. He is to follow with his Dr. Hyacinth Meeker, primary care provider or one of the local community clinics for ongoing symptom management. ____________________________________________  FINAL CLINICAL IMPRESSION(S) / ED DIAGNOSES  Final diagnoses:  Right-sided low back pain without sciatica  DDD (degenerative disc disease), lumbosacral     Lissa Hoard, PA-C 03/16/16 1400  Arnaldo Natal, MD 03/16/16 1512

## 2017-03-19 ENCOUNTER — Emergency Department
Admission: EM | Admit: 2017-03-19 | Discharge: 2017-03-19 | Disposition: A | Discharge status: Home / Self Care | Payer: Self-pay | Attending: Emergency Medicine | Admitting: Emergency Medicine

## 2017-03-19 ENCOUNTER — Emergency Department: Payer: Self-pay

## 2017-03-19 DIAGNOSIS — Z79899 Other long term (current) drug therapy: Secondary | ICD-10-CM | POA: Insufficient documentation

## 2017-03-19 DIAGNOSIS — Y9389 Activity, other specified: Secondary | ICD-10-CM | POA: Insufficient documentation

## 2017-03-19 DIAGNOSIS — Y998 Other external cause status: Secondary | ICD-10-CM | POA: Insufficient documentation

## 2017-03-19 DIAGNOSIS — M7532 Calcific tendinitis of left shoulder: Secondary | ICD-10-CM | POA: Insufficient documentation

## 2017-03-19 DIAGNOSIS — F17200 Nicotine dependence, unspecified, uncomplicated: Secondary | ICD-10-CM | POA: Insufficient documentation

## 2017-03-19 DIAGNOSIS — Z7982 Long term (current) use of aspirin: Secondary | ICD-10-CM | POA: Insufficient documentation

## 2017-03-19 DIAGNOSIS — Y929 Unspecified place or not applicable: Secondary | ICD-10-CM | POA: Insufficient documentation

## 2017-03-19 DIAGNOSIS — G8911 Acute pain due to trauma: Secondary | ICD-10-CM

## 2017-03-19 DIAGNOSIS — W010XXA Fall on same level from slipping, tripping and stumbling without subsequent striking against object, initial encounter: Secondary | ICD-10-CM | POA: Insufficient documentation

## 2017-03-19 DIAGNOSIS — M25512 Pain in left shoulder: Secondary | ICD-10-CM | POA: Insufficient documentation

## 2017-03-19 MED ORDER — DEXAMETHASONE SODIUM PHOSPHATE 10 MG/ML IJ SOLN
10.0000 mg | Freq: Once | INTRAMUSCULAR | Status: AC
  Administered 2017-03-19: 10 mg via INTRAMUSCULAR
  Filled 2017-03-19: qty 1

## 2017-03-19 MED ORDER — CYCLOBENZAPRINE HCL 5 MG PO TABS: 5.0000 mg | ORAL_TABLET | Freq: Three times a day (TID) | ORAL | 0 refills | Status: DC | PRN

## 2017-03-19 MED ORDER — DICLOFENAC SODIUM 75 MG PO TBEC: 75.0000 mg | DELAYED_RELEASE_TABLET | Freq: Two times a day (BID) | ORAL | 1 refills | Status: DC

## 2017-03-19 NOTE — ED Triage Notes (Signed)
Pt presents to ED via POV with c/o LEFT shoulder pain. Pt reports injury to the same shoulder 3 years ago; no recent injury or trauma reported. Pt reports limited ROM, CMS in distal extremity intact.

## 2017-03-19 NOTE — ED Notes (Signed)
Pt. States falling 3 days ago and injuring lt. Shoulder.  Pt. States taking OTC medication with little relief.  Pt. States he injured same shoulder in an accident 3 years ago.

## 2017-03-19 NOTE — ED Provider Notes (Signed)
[facility] Emergency Department Provider Note ____________________________________________  Time seen: 2040  I have reviewed the triage vital signs and the nursing notes.  HISTORY  Chief Complaint  Shoulder Pain  HPI Johnny Gonzales is a 56 y.o. male presents to the ED for evaluation of posterior left shoulder pain. Patient describes falling backwards, hitting his left shoulder blade about 3 days prior. Since that time he's had increasing pain and disability the left shoulder. He denies any head injury, loss of consciousness, or other injury at this time.  Past Medical History:  Diagnosis Date  . Back pain     Patient Active Problem List   Diagnosis Date Noted  . Perineal abscess 05/17/2015    Past Surgical History:  Procedure Laterality Date  . HAND SURGERY    . INCISION AND DRAINAGE ABSCESS N/A 04/30/2015   Procedure: INCISION AND DRAINAGE ABSCESS/PERINEAL ABSCESS;  Surgeon: Vanna ScotlandAshley Brandon, MD;  Location: [facility] ORS;  Service: Urology;  Laterality: N/A;    Prior to Admission medications   Medication Sig Start Date End Date Taking? Authorizing Provider  Aspirin-Salicylamide-Caffeine (BC HEADACHE POWDER PO) Take 1 packet by mouth daily as needed (pain and stiffness.).    [provider]  cyclobenzaprine (FLEXERIL) 5 MG tablet Take 1 tablet (5 mg total) by mouth 3 (three) times daily as needed for muscle spasms. 03/19/17   Kele Barthelemy, Charlesetta IvoryJenise V Bacon, PA-C  diclofenac (VOLTAREN) 75 MG EC tablet Take 1 tablet (75 mg total) by mouth 2 (two) times daily. 03/19/17   Margretta Zamorano, Charlesetta IvoryJenise V Bacon, PA-C  ketorolac (TORADOL) 10 MG tablet Take 1 tablet (10 mg total) by mouth every 8 (eight) hours. 03/15/16   Cheyene Hamric, Charlesetta IvoryJenise V Bacon, PA-C  traMADol (ULTRAM) 50 MG tablet Take 1 tablet (50 mg total) by mouth 2 (two) times daily. 03/15/16   Aishani Kalis, Charlesetta IvoryJenise V Bacon, PA-C    Allergies Patient has no known allergies.  Family History  Problem Relation Age of Onset  . Stroke  Father   . Heart attack Brother   . Heart attack Mother   . Bladder Cancer Neg Hx   . Prostate cancer Neg Hx   . Kidney cancer Neg Hx     Social History Social History  Substance Use Topics  . Smoking status: Current Every Day Smoker    Packs/day: 2.00  . Smokeless tobacco: Never Used  . Alcohol use Yes    Review of Systems  Constitutional: Negative for fever. Cardiovascular: Negative for chest pain. Respiratory: Negative for shortness of breath. Musculoskeletal: Negative for back pain. Left shoulder pain as above. Skin: Negative for rash. Neurological: Negative for headaches, focal weakness or numbness. ____________________________________________  PHYSICAL EXAM:  VITAL SIGNS: ED Triage Vitals  Enc Vitals Group     BP 03/19/17 1919 138/76     Pulse Rate 03/19/17 1919 74     Resp 03/19/17 1919 18     Temp 03/19/17 1919 98 F (36.7 C)     Temp Source 03/19/17 1919 Oral     SpO2 03/19/17 1919 97 %     Weight 03/19/17 1920 219 lb (99.3 kg)     Height 03/19/17 1920 6' (1.829 m)     Head Circumference --      Peak Flow --      Pain Score 03/19/17 1922 4     Pain Loc --      Pain Edu? --      Excl. in GC? --     Constitutional: Alert and  oriented. Well appearing and in no distress. Head: Normocephalic and atraumatic. Cardiovascular: Normal rate, regular rhythm. Normal distal pulses. Respiratory: Normal respiratory effort.  Musculoskeletal: Left shoulder without obvious deformity, dislocation, or sulcus sign. Patient with tenderness to light touch to the anterior, posterior, and caudal aspects of the shoulder. He also shows pain response palpation over the deltoid. He is able to demonstrate active range of motion of the shoulder although self-limited and some planes. Negative rotator cuff testing. Nontender with normal range of motion in all extremities.  Neurologic:  Normal gross sensation. Normal composite fist. Normal speech and language. No gross focal neurologic  deficits are appreciated. Skin:  Skin is warm, dry and intact. No rash noted. ______________________________   RADIOLOGY  Right Shoulder  IMPRESSION: No acute fracture or dislocation of the left shoulder. Possible early calcific tendinosis of the biceps tendon.  I, Kary Colaizzi, Charlesetta Ivory, personally viewed and evaluated these images (plain radiographs) as part of my medical decision making, as well as reviewing the written report by the radiologist. ____________________________________________  PROCEDURES  Decadron 10 mg IM ____________________________________________  INITIAL IMPRESSION / ASSESSMENT AND PLAN / ED COURSE  Presents to the ED for evaluation of acute left shoulder pain status post mechanical fall. No radiologic evidence of fracture or dislocation. He does have what appears to be some early calcific tendinitis of the biceps tendon on the right. Patient is discharged with a prescription for Voltaren and Flexeril doses are. He is referred to orthopedics for ongoing symptom management. ____________________________________________  FINAL CLINICAL IMPRESSION(S) / ED DIAGNOSES  Final diagnoses:  Acute pain of left shoulder due to trauma  Calcific tendinitis of left shoulder region      Lissa Hoard, PA-C 03/19/17 2315    Merrily Brittle, MD 03/19/17 2353

## 2017-03-19 NOTE — ED Notes (Signed)
Pt. Going home with friend 

## 2017-03-19 NOTE — Discharge Instructions (Signed)
Your exam and x-ray shows some mild shoulder tendinitis. You should start the anti-inflammatory as directed and muscle relaxant as needed. Follow-up with Dr. Dario AveKraskinski for continued symptoms. Apply ice to the area for pain and inflammation relief.

## 2020-05-20 ENCOUNTER — Emergency Department: Admission: EM | Admit: 2020-05-20 | Discharge: 2020-05-20 | Discharge status: Against Medical Advice | Payer: Self-pay

## 2020-08-15 ENCOUNTER — Other Ambulatory Visit: Payer: Self-pay

## 2020-08-15 ENCOUNTER — Emergency Department
Admission: EM | Admit: 2020-08-15 | Discharge: 2020-08-15 | Disposition: A | Discharge status: Home / Self Care | Payer: Self-pay | Attending: Emergency Medicine | Admitting: Emergency Medicine

## 2020-08-15 ENCOUNTER — Encounter: Payer: Self-pay | Admitting: Intensive Care

## 2020-08-15 DIAGNOSIS — W268XXA Contact with other sharp object(s), not elsewhere classified, initial encounter: Secondary | ICD-10-CM | POA: Insufficient documentation

## 2020-08-15 DIAGNOSIS — Z7982 Long term (current) use of aspirin: Secondary | ICD-10-CM | POA: Insufficient documentation

## 2020-08-15 DIAGNOSIS — F17210 Nicotine dependence, cigarettes, uncomplicated: Secondary | ICD-10-CM | POA: Insufficient documentation

## 2020-08-15 DIAGNOSIS — S61412A Laceration without foreign body of left hand, initial encounter: Secondary | ICD-10-CM | POA: Insufficient documentation

## 2020-08-15 MED ORDER — CEPHALEXIN 500 MG PO CAPS: 500.0000 mg | ORAL_CAPSULE | Freq: Three times a day (TID) | ORAL | 0 refills | Status: DC

## 2020-08-15 NOTE — ED Triage Notes (Signed)
Patient has wound present to left hand X2 days.

## 2020-08-15 NOTE — ED Provider Notes (Signed)
[facility] Emergency Department Provider Note  ____________________________________________   First MD Initiated Contact with Patient 08/15/20 1157     (approximate)  I have reviewed the triage vital signs and the nursing notes.   HISTORY  Chief Complaint Extremity Laceration    HPI Johnny Gonzales is a 59 y.o. male presents emergency department: Was complaining of a laceration to the left hand 2 days ago.  States he cannot go back to work until the area is healed.  He would like for [place] to suture it.  Tdap is up-to-date.    Past Medical History:  Diagnosis Date  . Back pain     Patient Active Problem List   Diagnosis Date Noted  . Perineal abscess 05/17/2015    Past Surgical History:  Procedure Laterality Date  . HAND SURGERY    . INCISION AND DRAINAGE ABSCESS N/A 04/30/2015   Procedure: INCISION AND DRAINAGE ABSCESS/PERINEAL ABSCESS;  Surgeon: Vanna Scotland, MD;  Location: [facility] ORS;  Service: Urology;  Laterality: N/A;    Prior to Admission medications   Medication Sig Start Date End Date Taking? Authorizing Provider  Aspirin-Salicylamide-Caffeine (BC HEADACHE POWDER PO) Take 1 packet by mouth daily as needed (pain and stiffness.).    [provider]  cephALEXin (KEFLEX) 500 MG capsule Take 1 capsule (500 mg total) by mouth 3 (three) times daily. 08/15/20   Landry Lookingbill, Roselyn Bering, PA-C  cyclobenzaprine (FLEXERIL) 5 MG tablet Take 1 tablet (5 mg total) by mouth 3 (three) times daily as needed for muscle spasms. 03/19/17   Menshew, Charlesetta Ivory, PA-C  diclofenac (VOLTAREN) 75 MG EC tablet Take 1 tablet (75 mg total) by mouth 2 (two) times daily. 03/19/17   Menshew, Charlesetta Ivory, PA-C  ketorolac (TORADOL) 10 MG tablet Take 1 tablet (10 mg total) by mouth every 8 (eight) hours. 03/15/16   Menshew, Charlesetta Ivory, PA-C  traMADol (ULTRAM) 50 MG tablet Take 1 tablet (50 mg total) by mouth 2 (two) times daily. 03/15/16   Menshew, Charlesetta Ivory, PA-C     Allergies Patient has no known allergies.  Family History  Problem Relation Age of Onset  . Stroke Father   . Heart attack Brother   . Heart attack Mother   . Bladder Cancer Neg Hx   . Prostate cancer Neg Hx   . Kidney cancer Neg Hx     Social History Social History   Tobacco Use  . Smoking status: Current Every Day Smoker    Packs/day: 2.00    Types: Cigarettes  . Smokeless tobacco: Never Used  Substance Use Topics  . Alcohol use: Yes    Alcohol/week: 21.0 standard drinks    Types: 21 Cans of beer per week  . Drug use: Yes    Types: Marijuana    Review of Systems  Constitutional: No fever/chills Eyes: No visual changes. ENT: No sore throat. Respiratory: Denies cough Genitourinary: Negative for dysuria. Musculoskeletal: Negative for back pain. Skin: Negative for rash.  Positive laceration Psychiatric: no mood changes,     ____________________________________________   PHYSICAL EXAM:  VITAL SIGNS: ED Triage Vitals  Enc Vitals Group     BP 08/15/20 1103 (!) 136/118     Pulse Rate 08/15/20 1103 78     Resp 08/15/20 1103 18     Temp 08/15/20 1103 97.6 F (36.4 C)     Temp Source 08/15/20 1103 Oral     SpO2 08/15/20 1103 99 %     Weight  08/15/20 1117 230 lb (104.3 kg)     Height 08/15/20 1117 6' (1.829 m)     Head Circumference --      Peak Flow --      Pain Score 08/15/20 1117 1     Pain Loc --      Pain Edu? --      Excl. in GC? --     Constitutional: Alert and oriented. Well appearing and in no acute distress. Eyes: Conjunctivae are normal.  Head: Atraumatic. Nose: No congestion/rhinnorhea. Mouth/Throat: Mucous membranes are moist.   Neck:  supple no lymphadenopathy noted Cardiovascular: Normal rate, regular rhythm.  Respiratory: Normal respiratory effort.  No retractions, GU: deferred Musculoskeletal: FROM all extremities, warm and well perfused, 1.5 cm laceration noted between the thumb and index finger of the left hand, area is  already beginning to heal, no bleeding is noted, neurovascular is intact no foreign body Neurologic:  Normal speech and language.  Skin:  Skin is warm, dry  No rash noted. Psychiatric: Mood and affect are normal. Speech and behavior are normal.  ____________________________________________   LABS (all labs ordered are listed, but only abnormal results are displayed)  Labs Reviewed - No data to display ____________________________________________   ____________________________________________  RADIOLOGY    ____________________________________________   PROCEDURES  Procedure(s) performed:   Marland KitchenMarland KitchenLaceration Repair  Date/Time: 08/15/2020 12:48 PM Performed by: Faythe Ghee, PA-C Authorized by: Faythe Ghee, PA-C   Consent:    Consent obtained:  Verbal   Consent given by:  Patient   Risks discussed:  Infection, pain, poor cosmetic result and poor wound healing Anesthesia (see MAR for exact dosages):    Anesthesia method:  None Laceration details:    Location:  Hand   Hand location:  L hand, dorsum   Length (cm):  1.5 Repair type:    Repair type:  Simple Exploration:    Wound extent: no foreign bodies/material noted, no muscle damage noted, no nerve damage noted, no tendon damage noted and no vascular damage noted   Treatment:    Area cleansed with:  Saline   Amount of cleaning:  Standard   Irrigation solution:  Sterile saline   Irrigation method:  Tap Skin repair:    Repair method:  Steri-Strips Approximation:    Approximation:  Close Post-procedure details:    Patient tolerance of procedure:  Tolerated well, no immediate complications Comments:     Performed by Theron Arista, PA student under my supervision      ____________________________________________   INITIAL IMPRESSION / ASSESSMENT AND PLAN / ED COURSE  Pertinent labs & imaging results that were available during my care of the patient were reviewed by me and considered in my medical decision  making (see chart for details).   Patient is a 59 year old male presents with a 49-day-old laceration to the left hand.  See HPI.  Physical exam shows the area to still be slightly open but healing.  Did apply Steri-Strips to the left hand, placed on keflex, work note, return if worsening     Tell Rozelle was evaluated in Emergency Department on 08/15/2020 for the symptoms described in the history of present illness. He was evaluated in the context of the global COVID-19 pandemic, which necessitated consideration that the patient might be at risk for infection with the SARS-CoV-2 virus that causes COVID-19. Institutional protocols and algorithms that pertain to the evaluation of patients at risk for COVID-19 are in a state of rapid change based on information released by  regulatory bodies including the CDC and federal and state organizations. These policies and algorithms were followed during the patient's care in the ED.    As part of my medical decision making, I reviewed the following data within the electronic MEDICAL RECORD NUMBER Nursing notes reviewed and incorporated, Old chart reviewed, Notes from prior ED visits and [place] Controlled Substance Database  ____________________________________________   FINAL CLINICAL IMPRESSION(S) / ED DIAGNOSES  Final diagnoses:  Laceration of left hand without foreign body, initial encounter      NEW MEDICATIONS STARTED DURING THIS VISIT:  New Prescriptions   CEPHALEXIN (KEFLEX) 500 MG CAPSULE    Take 1 capsule (500 mg total) by mouth 3 (three) times daily.     Note:  This document was prepared using Dragon voice recognition software and may include unintentional dictation errors.    Faythe Ghee, PA-C 08/15/20 1252    Jene Every, MD 08/15/20 [phone]

## 2020-08-15 NOTE — Discharge Instructions (Signed)
Follow up with your regular doctor as needed, return to the ER if worsening, keep the area as dry as possible

## 2020-08-15 NOTE — ED Notes (Signed)
See triage note, pt reports getting cut to right hand in between thumb and pointer finger while opening can of pork.  Small laceration noted, no bleeding at this time.  NAD noted.

## 2020-12-28 ENCOUNTER — Other Ambulatory Visit: Payer: Self-pay

## 2020-12-28 ENCOUNTER — Emergency Department
Admission: EM | Admit: 2020-12-28 | Discharge: 2020-12-28 | Disposition: A | Discharge status: Home / Self Care | Payer: Self-pay | Attending: Emergency Medicine | Admitting: Emergency Medicine

## 2020-12-28 ENCOUNTER — Emergency Department: Payer: Self-pay

## 2020-12-28 DIAGNOSIS — Z7982 Long term (current) use of aspirin: Secondary | ICD-10-CM | POA: Insufficient documentation

## 2020-12-28 DIAGNOSIS — M660 Rupture of popliteal cyst: Secondary | ICD-10-CM | POA: Insufficient documentation

## 2020-12-28 DIAGNOSIS — F17210 Nicotine dependence, cigarettes, uncomplicated: Secondary | ICD-10-CM | POA: Insufficient documentation

## 2020-12-28 LAB — BASIC METABOLIC PANEL
Anion gap: 6 (ref 5–15)
BUN: 16 mg/dL (ref 6–20)
CO2: 24 mmol/L (ref 22–32)
Calcium: 9.1 mg/dL (ref 8.9–10.3)
Chloride: 106 mmol/L (ref 98–111)
Creatinine, Ser: 0.89 mg/dL (ref 0.61–1.24)
GFR, Estimated: >60 mL/min (ref >60)
Glucose, Bld: 108 mg/dL — ABNORMAL HIGH (ref 70–99)
Potassium: 3.8 mmol/L (ref 3.5–5.1)
Sodium: 136 mmol/L (ref 135–145)

## 2020-12-28 LAB — CBC WITH DIFFERENTIAL/PLATELET
Abs Immature Granulocytes: 0.03 10*3/uL (ref 0.00–0.07)
Basophils Absolute: 0.1 10*3/uL (ref 0.0–0.1)
Basophils Relative: 1 %
Eosinophils Absolute: 0.3 10*3/uL (ref 0.0–0.5)
Eosinophils Relative: 3 %
HCT: 43.2 % (ref 39.0–52.0)
Hemoglobin: 14.2 g/dL (ref 13.0–17.0)
Immature Granulocytes: 0 %
Lymphocytes Relative: 35 %
Lymphs Abs: 3.3 10*3/uL (ref 0.7–4.0)
MCH: 27.3 pg (ref 26.0–34.0)
MCHC: 32.9 g/dL (ref 30.0–36.0)
MCV: 82.9 fL (ref 80.0–100.0)
Monocytes Absolute: 0.8 10*3/uL (ref 0.1–1.0)
Monocytes Relative: 8 %
Neutro Abs: 5 10*3/uL (ref 1.7–7.7)
Neutrophils Relative %: 53 %
Platelets: 246 10*3/uL (ref 150–400)
RBC: 5.21 MIL/uL (ref 4.22–5.81)
RDW: 13.6 % (ref 11.5–15.5)
WBC: 9.4 10*3/uL (ref 4.0–10.5)
nRBC: 0 % (ref 0.0–0.2)

## 2020-12-28 LAB — URIC ACID: Uric Acid, Serum: 6.4 mg/dL (ref 3.7–8.6)

## 2020-12-28 MED ORDER — HYDROXYZINE HCL 50 MG PO TABS
50.0000 mg | ORAL_TABLET | Freq: Once | ORAL | Status: AC
  Administered 2020-12-28: 50 mg via ORAL
  Filled 2020-12-28: qty 1

## 2020-12-28 MED ORDER — OXYCODONE-ACETAMINOPHEN 5-325 MG PO TABS
1.0000 | ORAL_TABLET | Freq: Once | ORAL | Status: AC
  Administered 2020-12-28: 1 via ORAL
  Filled 2020-12-28: qty 1

## 2020-12-28 MED ORDER — KETOROLAC TROMETHAMINE 30 MG/ML IJ SOLN
30.0000 mg | Freq: Once | INTRAMUSCULAR | Status: AC
  Administered 2020-12-28: 30 mg via INTRAVENOUS
  Filled 2020-12-28: qty 1

## 2020-12-28 MED ORDER — OXYCODONE-ACETAMINOPHEN 5-325 MG PO TABS: 1.0000 | ORAL_TABLET | Freq: Four times a day (QID) | ORAL | 0 refills | Status: DC | PRN

## 2020-12-28 MED ORDER — IBUPROFEN 600 MG PO TABS: 600.0000 mg | ORAL_TABLET | Freq: Three times a day (TID) | ORAL | 0 refills | Status: DC | PRN

## 2020-12-28 NOTE — Discharge Instructions (Signed)
Ultrasound findings were nonspecific.  Follow-up with orthopedics for definitive evaluation.  Keep the leg elevated when sitting or laying.  Ambulate with crutches.  Be advised pain medication may cause drowsiness.

## 2020-12-28 NOTE — ED Triage Notes (Signed)
Pt in via EMS from home with c/o pain to right knee with swelling. Pt reports hx of pain ut has gotten worse over the day and now it hurts to walk. 135/93, 98%, HR 84

## 2020-12-28 NOTE — ED Provider Notes (Signed)
[facility] Emergency Department Provider Note   ____________________________________________   Event Date/Time   First MD Initiated Contact with Patient 12/28/20 1339     (approximate)  I have reviewed the triage vital signs and the nursing notes.   HISTORY  Chief Complaint Knee Pain    HPI Johnny Gonzales is a 60 y.o. male patient presents with right leg and right knee which is worsened the past few days.  Patient with pain has increased and is unable to weight-bear without discomfort.  Patient denies provocative incident for complaint.  Denies history of gout or arthritis.  Denies dyspnea or chest pain.  Rates pain as a 10/10.  Described pain as "achy".  No palliative measure for complaint.         Past Medical History:  Diagnosis Date  . Back pain     Patient Active Problem List   Diagnosis Date Noted  . Perineal abscess 05/17/2015    Past Surgical History:  Procedure Laterality Date  . HAND SURGERY    . INCISION AND DRAINAGE ABSCESS N/A 04/30/2015   Procedure: INCISION AND DRAINAGE ABSCESS/PERINEAL ABSCESS;  Surgeon: Vanna Scotland, MD;  Location: [facility] ORS;  Service: Urology;  Laterality: N/A;    Prior to Admission medications   Medication Sig Start Date End Date Taking? Authorizing Provider  ibuprofen (ADVIL) 600 MG tablet Take 1 tablet (600 mg total) by mouth every 8 (eight) hours as needed. 12/28/20  Yes Joni Reining, PA-C  oxyCODONE-acetaminophen (PERCOCET) 5-325 MG tablet Take 1 tablet by mouth every 6 (six) hours as needed for moderate pain. 12/28/20 12/28/21 Yes Joni Reining, PA-C  Aspirin-Salicylamide-Caffeine (BC HEADACHE POWDER PO) Take 1 packet by mouth daily as needed (pain and stiffness.).    [provider]  cephALEXin (KEFLEX) 500 MG capsule Take 1 capsule (500 mg total) by mouth 3 (three) times daily. 08/15/20   Fisher, Roselyn Bering, PA-C  cyclobenzaprine (FLEXERIL) 5 MG tablet Take 1 tablet (5 mg total) by mouth 3  (three) times daily as needed for muscle spasms. 03/19/17   Menshew, Charlesetta Ivory, PA-C  diclofenac (VOLTAREN) 75 MG EC tablet Take 1 tablet (75 mg total) by mouth 2 (two) times daily. 03/19/17   Menshew, Charlesetta Ivory, PA-C  ketorolac (TORADOL) 10 MG tablet Take 1 tablet (10 mg total) by mouth every 8 (eight) hours. 03/15/16   Menshew, Charlesetta Ivory, PA-C  traMADol (ULTRAM) 50 MG tablet Take 1 tablet (50 mg total) by mouth 2 (two) times daily. 03/15/16   Menshew, Charlesetta Ivory, PA-C    Allergies Patient has no known allergies.  Family History  Problem Relation Age of Onset  . Stroke Father   . Heart attack Brother   . Heart attack Mother   . Bladder Cancer Neg Hx   . Prostate cancer Neg Hx   . Kidney cancer Neg Hx     Social History Social History   Tobacco Use  . Smoking status: Current Every Day Smoker    Packs/day: 2.00    Types: Cigarettes  . Smokeless tobacco: Never Used  Substance Use Topics  . Alcohol use: Yes    Alcohol/week: 21.0 standard drinks    Types: 21 Cans of beer per week  . Drug use: Yes    Types: Marijuana    Review of Systems Constitutional: No fever/chills Eyes: No visual changes. ENT: No sore throat. Cardiovascular: Denies chest pain. Respiratory: Denies shortness of breath. Gastrointestinal: No abdominal pain.  No  nausea, no vomiting.  No diarrhea.  No constipation. Genitourinary: Negative for dysuria. Musculoskeletal: Right calf and knee pain. Skin: Negative for rash. Neurological: Negative for headaches, focal weakness or numbness.  ____________________________________________   PHYSICAL EXAM:  VITAL SIGNS: ED Triage Vitals  Enc Vitals Group     BP 12/28/20 1218 134/87     Pulse Rate 12/28/20 1216 79     Resp 12/28/20 1216 18     Temp 12/28/20 1216 98.3 F (36.8 C)     Temp Source 12/28/20 1216 Oral     SpO2 12/28/20 1216 97 %     Weight 12/28/20 1216 250 lb (113.4 kg)     Height 12/28/20 1216 6' (1.829 m)     Head Circumference  --      Peak Flow --      Pain Score 12/28/20 1216 10     Pain Loc --      Pain Edu? --      Excl. in GC? --    Constitutional: Alert and oriented. Well appearing and in no acute distress. Hematological/Lymphatic/Immunilogical: No cervical lymphadenopathy. Cardiovascular: Normal rate, regular rhythm. Grossly normal heart sounds.  Good peripheral circulation. Respiratory: Normal respiratory effort.  No retractions. Lungs CTAB. Gastrointestinal: Soft and nontender. No distention. No abdominal bruits. No CVA tenderness. Genitourinary: Deferred Musculoskeletal: Obvious edema to the right lower extremity.  Moderate guarding palpation of the calf and popliteal area.  No joint effusions. Neurologic:  Normal speech and language. No gross focal neurologic deficits are appreciated. No gait instability. Skin:  Skin is warm, dry and intact. No rash noted. Psychiatric: Mood and affect are normal. Speech and behavior are normal.  ____________________________________________   LABS (all labs ordered are listed, but only abnormal results are displayed)  Labs Reviewed  BASIC METABOLIC PANEL - Abnormal; Notable for the following components:      Result Value   Glucose, Bld 108 (*)    All other components within normal limits  CBC WITH DIFFERENTIAL/PLATELET  URIC ACID   ____________________________________________  EKG   ____________________________________________  RADIOLOGY I, Joni Reining, personally viewed and evaluated these images (plain radiographs) as part of my medical decision making, as well as reviewing the written report by the radiologist.  ED MD interpretation:    Official radiology report(s): US Venous Img Lower Unilateral Right  Result Date: 12/28/2020 CLINICAL DATA:  Pain/edematous calf and knee. EXAM: RIGHT LOWER EXTREMITY VENOUS DOPPLER ULTRASOUND TECHNIQUE: Gray-scale sonography with compression, as well as color and duplex ultrasound, were performed to evaluate the  deep venous system(s) from the level of the common femoral vein through the popliteal and proximal calf veins. COMPARISON:  None. FINDINGS: VENOUS Normal compressibility of the common femoral, superficial femoral, and popliteal veins, as well as the visualized calf veins. Visualized portions of profunda femoral vein and great saphenous vein unremarkable. No filling defects to suggest DVT on grayscale or color Doppler imaging. Doppler waveforms show normal direction of venous flow, normal respiratory plasticity and response to augmentation. Limited views of the contralateral common femoral vein are unremarkable. OTHER Popliteal fossa fluid collection measuring 4 x 2.7 x 7.2 cm. Ultrasound tech reports a lateral knee palpable area of 7 x 8 x 4 cm. Limitations: none IMPRESSION: 1. No deep venous thrombosis of the right lower extremity. 2. Popliteal fossa fluid collection measuring 4 x 2.7 x 7.2 cm. An origin between the semimembranosus and medial gastrocnemius tendons was not visualized to determine this a popliteal/Baker's cyst. Finding is nonspecific. Differential diagnosis  includes a large parameniscal cyst, liquified hematoma, or other lesion. Electronically Signed   By: Tish Frederickson M.D.   On: 12/28/2020 15:09    ____________________________________________   PROCEDURES  Procedure(s) performed (including Critical Care):  Procedures   ____________________________________________   INITIAL IMPRESSION / ASSESSMENT AND PLAN / ED COURSE  As part of my medical decision making, I reviewed the following data within the electronic MEDICAL RECORD NUMBER         Patient with via EMS complaining of pain to the knee with swelling to the calf and back of the leg.  Differential consist of DVT, gout, or knee effusion.  Discussed no acute findings on labs.  Patient ultrasound was negative for DVT.  Findings consistent with ruptured popliteal or calf cyst.  Patient placed in a spiral Ace wrap and advised to  ambulate with support.  Follow discharge care instructions consisting of elevation when sitting or laying down.  Follow-up with orthopedics by calling for an appointment in 2 days.  Advised withdraws effects of pain medication.  Advised return back to ED if condition worsen before orthopedic appointment.     ____________________________________________   FINAL CLINICAL IMPRESSION(S) / ED DIAGNOSES  Final diagnoses:  Ruptured synovial cyst of popliteal space     ED Discharge Orders         Ordered    ibuprofen (ADVIL) 600 MG tablet  Every 8 hours PRN        12/28/20 1546    oxyCODONE-acetaminophen (PERCOCET) 5-325 MG tablet  Every 6 hours PRN        12/28/20 1546          *Please note:  Johnny Gonzales was evaluated in Emergency Department on 12/28/2020 for the symptoms described in the history of present illness. He was evaluated in the context of the global COVID-19 pandemic, which necessitated consideration that the patient might be at risk for infection with the SARS-CoV-2 virus that causes COVID-19. Institutional protocols and algorithms that pertain to the evaluation of patients at risk for COVID-19 are in a state of rapid change based on information released by regulatory bodies including the CDC and federal and state organizations. These policies and algorithms were followed during the patient's care in the ED.  Some ED evaluations and interventions may be delayed as a result of limited staffing during and the pandemic.*   Note:  This document was prepared using Dragon voice recognition software and may include unintentional dictation errors.    Joni Reining, PA-C 12/28/20 1551    Sharman Cheek, MD 12/28/20 [phone]

## 2020-12-28 NOTE — ED Notes (Signed)
Ultrasound at bedside

## 2020-12-28 NOTE — ED Notes (Signed)
Patient declined discharge vital signs. 

## 2021-08-09 ENCOUNTER — Encounter: Payer: Self-pay | Admitting: Intensive Care

## 2021-08-09 ENCOUNTER — Emergency Department
Admission: EM | Admit: 2021-08-09 | Discharge: 2021-08-09 | Disposition: A | Discharge status: Home / Self Care | Payer: Self-pay | Attending: Emergency Medicine | Admitting: Emergency Medicine

## 2021-08-09 ENCOUNTER — Emergency Department: Payer: Self-pay

## 2021-08-09 ENCOUNTER — Other Ambulatory Visit: Payer: Self-pay

## 2021-08-09 DIAGNOSIS — M170 Bilateral primary osteoarthritis of knee: Secondary | ICD-10-CM | POA: Insufficient documentation

## 2021-08-09 DIAGNOSIS — M47816 Spondylosis without myelopathy or radiculopathy, lumbar region: Secondary | ICD-10-CM | POA: Insufficient documentation

## 2021-08-09 DIAGNOSIS — M545 Low back pain, unspecified: Secondary | ICD-10-CM

## 2021-08-09 DIAGNOSIS — F17210 Nicotine dependence, cigarettes, uncomplicated: Secondary | ICD-10-CM | POA: Insufficient documentation

## 2021-08-09 MED ORDER — LIDOCAINE 5 % EX PTCH
1.0000 | MEDICATED_PATCH | CUTANEOUS | Status: DC
  Administered 2021-08-09: 1 via TRANSDERMAL
  Filled 2021-08-09: qty 1

## 2021-08-09 MED ORDER — TRAMADOL HCL 50 MG PO TABS: 50.0000 mg | ORAL_TABLET | Freq: Four times a day (QID) | ORAL | 0 refills | Status: DC | PRN

## 2021-08-09 MED ORDER — OXYCODONE-ACETAMINOPHEN 5-325 MG PO TABS
1.0000 | ORAL_TABLET | Freq: Once | ORAL | Status: AC
  Administered 2021-08-09: 1 via ORAL
  Filled 2021-08-09: qty 1

## 2021-08-09 MED ORDER — LIDOCAINE 5 % EX PTCH: 1.0000 | MEDICATED_PATCH | Freq: Two times a day (BID) | CUTANEOUS | 0 refills | Status: AC

## 2021-08-09 MED ORDER — KETOROLAC TROMETHAMINE 30 MG/ML IJ SOLN
30.0000 mg | Freq: Once | INTRAMUSCULAR | Status: AC
Start: 2021-08-09 — End: 2021-08-09
  Administered 2021-08-09: 30 mg via INTRAMUSCULAR
  Filled 2021-08-09: qty 1

## 2021-08-09 MED ORDER — MELOXICAM 15 MG PO TABS: 15.0000 mg | ORAL_TABLET | Freq: Every day | ORAL | 2 refills | Status: AC

## 2021-08-09 NOTE — ED Provider Notes (Signed)
Emergency Medicine Provider Triage Evaluation Note  Rushton Early , a 60 y.o. male  was evaluated in triage.  Pt complains of low back pain and bilateral knee pain. This started a few days ago. Describes the pain as sharp and stabbing. He reports associated, numbness, tingling and weakness. He denies recent fall or trauma. No hx of back surgeries  Review of Systems  Positive: Low back pain, bilateral knee pain, numbness, tingling weakness of the lower extremities Negative: Constipation, diarrhea, blood in the stool, urinary urgency, frequency, dysuria or blood in his urine, joint swelling  Physical Exam  BP 126/79 (BP Location: Left Arm)   Pulse 80   Temp 98.1 F (36.7 C) (Oral)   Resp 16   Ht 5\' 9"  (1.753 m)   Wt 113.4 kg   SpO2 98%   BMI 36.92 kg/m  Gen:   Awake, no distress   Resp:  Normal effort  MSK:   In wheelchair at this time   Medical Decision Making  Medically screening exam initiated at 10:46 AM.  Appropriate orders placed.  Mehki Klumpp was informed that the remainder of the evaluation will be completed by another provider, this initial triage assessment does not replace that evaluation, and the importance of remaining in the ED until their evaluation is complete.  Bilateral Low Back Pain, Bilateral Knee Pain:  Xray lumbar spine Xray bilateral knee   [name], NP 08/09/21 1054    08/11/21, MD 08/13/21 [phone]

## 2021-08-09 NOTE — ED Notes (Signed)
Patient declined discharge vital signs. 

## 2021-08-09 NOTE — ED Provider Notes (Signed)
[facility] Emergency Department Provider Note  ____________________________________________   Event Date/Time   First MD Initiated Contact with Patient 08/09/21 1241     (approximate)  I have reviewed the triage vital signs and the nursing notes.   HISTORY  Chief Complaint Knee Pain    HPI Johnny Gonzales is a 60 y.o. male presents emergency department complaining of low back pain and bilateral knee pain.  No known injury.  Patient states that I have arthritis but this is gotten severe.  Some difficulty going to work.  He denies any fever or chills.  No new injury.  Past Medical History:  Diagnosis Date   Back pain     Patient Active Problem List   Diagnosis Date Noted   Perineal abscess 05/17/2015    Past Surgical History:  Procedure Laterality Date   HAND SURGERY     INCISION AND DRAINAGE ABSCESS N/A 04/30/2015   Procedure: INCISION AND DRAINAGE ABSCESS/PERINEAL ABSCESS;  Surgeon: Vanna Scotland, MD;  Location: [facility] ORS;  Service: Urology;  Laterality: N/A;    Prior to Admission medications   Medication Sig Start Date End Date Taking? Authorizing Provider  lidocaine (LIDODERM) 5 % Place 1 patch onto the skin every 12 (twelve) hours. Remove & Discard patch within 12 hours or as directed by MD 08/09/21 08/09/22 Yes Ciani Rutten, Roselyn Bering, PA-C  meloxicam (MOBIC) 15 MG tablet Take 1 tablet (15 mg total) by mouth daily. 08/09/21 08/09/22 Yes Tiffny Gemmer, Roselyn Bering, PA-C  traMADol (ULTRAM) 50 MG tablet Take 1 tablet (50 mg total) by mouth every 6 (six) hours as needed. 08/09/21  Yes Hari Casaus, Roselyn Bering, PA-C  Aspirin-Salicylamide-Caffeine (BC HEADACHE POWDER PO) Take 1 packet by mouth daily as needed (pain and stiffness.).    [provider]    Allergies Patient has no known allergies.  Family History  Problem Relation Age of Onset   Stroke Father    Heart attack Brother    Heart attack Mother    Bladder Cancer Neg Hx    Prostate cancer Neg Hx     Kidney cancer Neg Hx     Social History Social History   Tobacco Use   Smoking status: Every Day    Packs/day: 2.00    Types: Cigarettes   Smokeless tobacco: Never  Substance Use Topics   Alcohol use: Yes    Alcohol/week: 2.0 standard drinks    Types: 2 Cans of beer per week   Drug use: Yes    Types: Cocaine    Review of Systems  Constitutional: No fever/chills Eyes: No visual changes. ENT: No sore throat. Respiratory: Denies cough Cardiovascular: Denies chest pain Gastrointestinal: Denies abdominal pain Genitourinary: Negative for dysuria. Musculoskeletal: Positive for back pain. Skin: Negative for rash. Psychiatric: no mood changes,     ____________________________________________   PHYSICAL EXAM:  VITAL SIGNS: ED Triage Vitals  Enc Vitals Group     BP 08/09/21 1039 126/79     Pulse Rate 08/09/21 1039 80     Resp 08/09/21 1039 16     Temp 08/09/21 1039 98.1 F (36.7 C)     Temp Source 08/09/21 1039 Oral     SpO2 08/09/21 1039 98 %     Weight 08/09/21 1043 250 lb (113.4 kg)     Height 08/09/21 1043 5\' 9"  (1.753 m)     Head Circumference --      Peak Flow --      Pain Score 08/09/21 1042 10  Pain Loc --      Pain Edu? --      Excl. in GC? --     Constitutional: Alert and oriented. Well appearing and in no acute distress. Eyes: Conjunctivae are normal.  Head: Atraumatic. Nose: No congestion/rhinnorhea. Mouth/Throat: Mucous membranes are moist.   Neck:  supple no lymphadenopathy noted Cardiovascular: Normal rate, regular rhythm.  Respiratory: Normal respiratory effort.  No retractions, GU: deferred Musculoskeletal: FROM all extremities, warm and well perfused, arthritis noted in both knees, patient has reproduced pain with extension and flexion of the knees, lumbar spine is tender to palpation, neurovascular is intact Neurologic:  Normal speech and language.  Skin:  Skin is warm, dry and intact. No rash noted. Psychiatric: Mood and affect are  normal. Speech and behavior are normal.  ____________________________________________   LABS (all labs ordered are listed, but only abnormal results are displayed)  Labs Reviewed - No data to display ____________________________________________   ____________________________________________  RADIOLOGY  X-ray of the right and left knee, lumbar spine  ____________________________________________   PROCEDURES  Procedure(s) performed: Lidoderm patches applied to the left knee and lower back   Procedures    ____________________________________________   INITIAL IMPRESSION / ASSESSMENT AND PLAN / ED COURSE  Pertinent labs & imaging results that were available during my care of the patient were reviewed by me and considered in my medical decision making (see chart for details).   The patient is a 60 year old male presents with acute on chronic pain of both knees and his lower back.  See HPI.  Physical exam shows patient per stable.  X-rays reviewed by me and confirmed by radiologist does show severe amount of arthritis of the both knees, there are tricompartmental.  Lumbar spine x-ray has facet arthropathy.  Patient did have some relief with the Lidoderm patches, Toradol injection, and Percocet p.o.  He is to follow-up with orthopedics concerning his knees.  He may actually be at the point where he needs a knee replacement.  He was given a prescription for pain medication, Lidoderm patches, and meloxicam.  He was also given a work note for today and tomorrow.  He is to follow-up with orthopedics or his regular doctor.  He was discharged stable condition.     Johnny Gonzales was evaluated in Emergency Department on 08/09/2021 for the symptoms described in the history of present illness. He was evaluated in the context of the global COVID-19 pandemic, which necessitated consideration that the patient might be at risk for infection with the SARS-CoV-2 virus that causes COVID-19.  Institutional protocols and algorithms that pertain to the evaluation of patients at risk for COVID-19 are in a state of rapid change based on information released by regulatory bodies including the CDC and federal and state organizations. These policies and algorithms were followed during the patient's care in the ED.    As part of my medical decision making, I reviewed the following data within the electronic MEDICAL RECORD NUMBER Nursing notes reviewed and incorporated, Old chart reviewed, Radiograph reviewed , Notes from prior ED visits, and [place] Controlled Substance Database  ____________________________________________   FINAL CLINICAL IMPRESSION(S) / ED DIAGNOSES  Final diagnoses:  Acute exacerbation of chronic low back pain  Arthritis of both knees      NEW MEDICATIONS STARTED DURING THIS VISIT:  Discharge Medication List as of 08/09/2021  1:50 PM     START taking these medications   Details  lidocaine (LIDODERM) 5 % Place 1 patch onto the skin every 12 (  twelve) hours. Remove & Discard patch within 12 hours or as directed by MD, Starting Sat 08/09/2021, Until Sun 08/09/2022, Normal    meloxicam (MOBIC) 15 MG tablet Take 1 tablet (15 mg total) by mouth daily., Starting Sat 08/09/2021, Until Sun 08/09/2022, Normal         Note:  This document was prepared using Dragon voice recognition software and may include unintentional dictation errors.    Faythe Ghee, PA-C 08/09/21 1612    Merwyn Katos, MD 08/13/21 [phone]

## 2021-08-09 NOTE — ED Triage Notes (Addendum)
Patient c/o left and right knee/leg pain for a few days. Denies recent injuries. Also reports lower back pain. Reports pain is constant

## 2021-11-27 IMAGING — CR DG KNEE COMPLETE 4+V*L*
4 series · 4 of 4 positions shown · non-contrast
Comparison: None.

CLINICAL DATA: low back pain

EXAM:
LEFT KNEE - COMPLETE 4+ VIEW

[knee ap (1 of 2)]
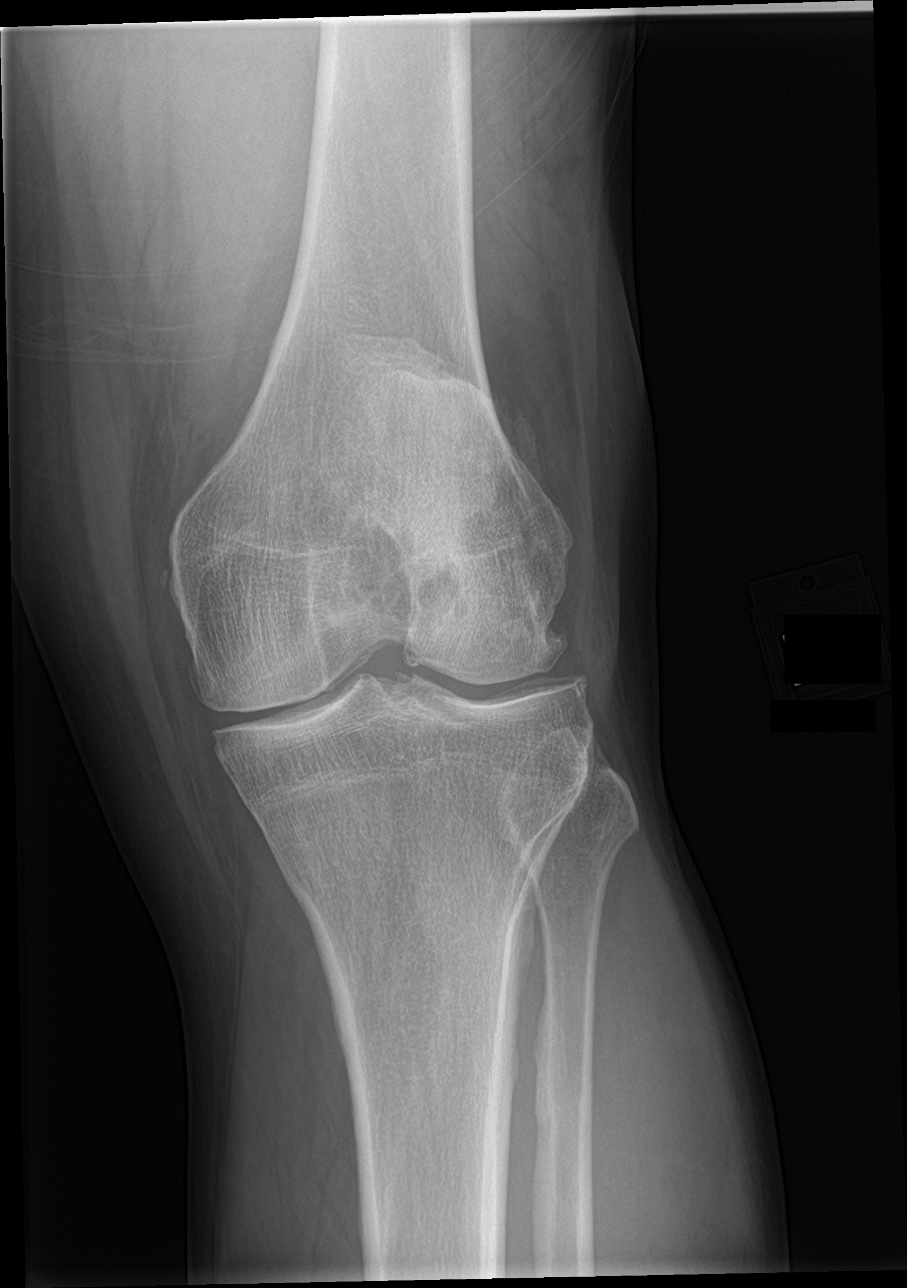

[sunrise]
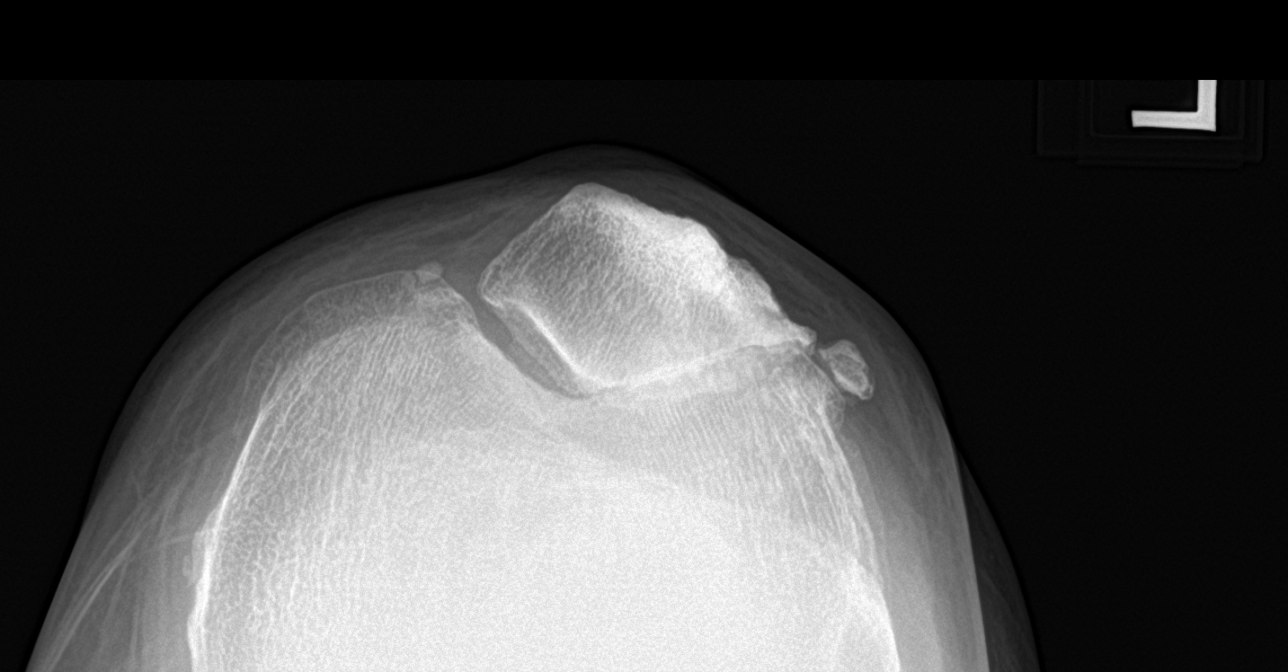

[knee lat]
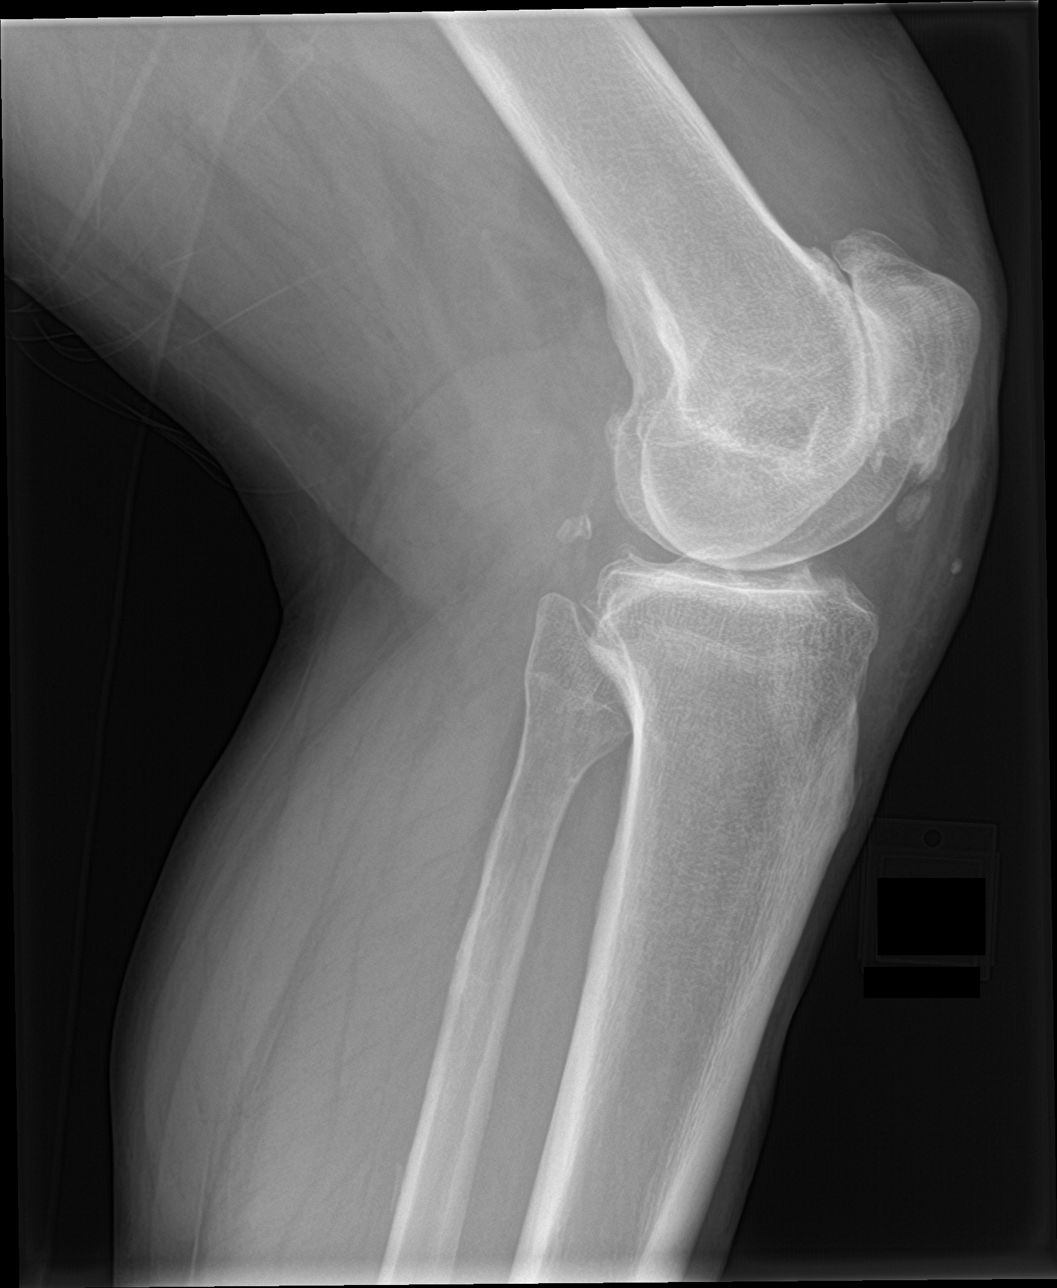

[knee ap (2 of 2)]
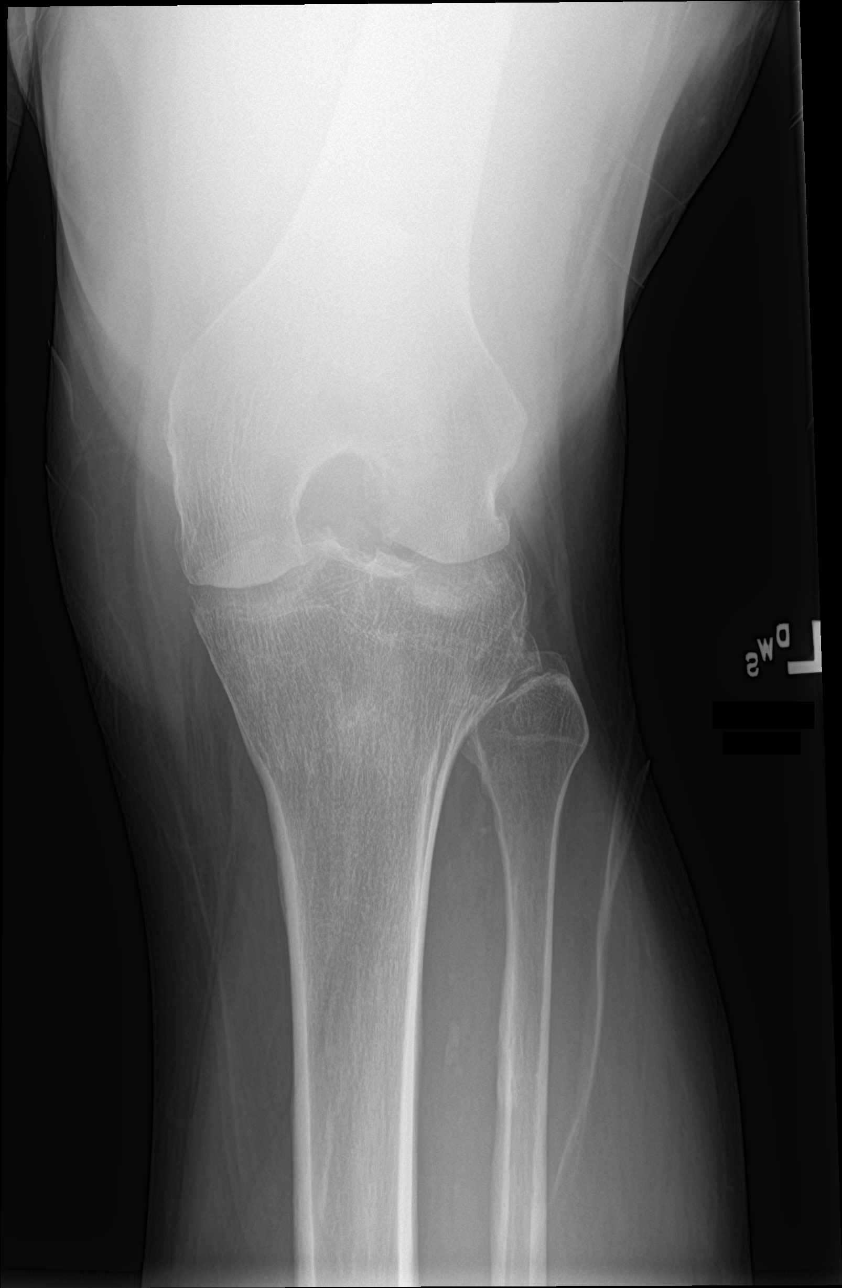

[4 of 4 positions shown; findings below may reference images not displayed]

FINDINGS: Severe degenerative changes of the patellofemoral compartment with
lateral subluxation of the patella. Mild and moderate degenerative
changes of the medial and lateral compartment, respectively. No
acute fracture. No knee joint effusion.
IMPRESSION: Tricompartmental degenerative changes of the knee which are most
advanced and severe in the patellofemoral compartment with lateral
subluxation of the patella.

## 2024-07-18 ENCOUNTER — Encounter: Payer: Self-pay | Admitting: Emergency Medicine

## 2024-07-18 ENCOUNTER — Ambulatory Visit: Admission: EM | Admit: 2024-07-18 | Discharge: 2024-07-18 | Disposition: A | Discharge status: Home / Self Care

## 2024-07-18 DIAGNOSIS — G8929 Other chronic pain: Secondary | ICD-10-CM | POA: Diagnosis not present

## 2024-07-18 DIAGNOSIS — M5441 Lumbago with sciatica, right side: Secondary | ICD-10-CM | POA: Diagnosis not present

## 2024-07-18 DIAGNOSIS — M5442 Lumbago with sciatica, left side: Secondary | ICD-10-CM

## 2024-07-18 DIAGNOSIS — M48062 Spinal stenosis, lumbar region with neurogenic claudication: Secondary | ICD-10-CM

## 2024-07-18 MED ORDER — HYDROCODONE-ACETAMINOPHEN 10-325 MG PO TABS: 1.0000 | ORAL_TABLET | Freq: Three times a day (TID) | ORAL | 0 refills | Status: AC | PRN

## 2024-07-18 NOTE — ED Triage Notes (Signed)
 Pt presents with bilateral leg numbness and back pain x 5 months. He was seen in the ED on 07/12/24 and prescribed Robaxin and gabapentin.

## 2024-07-18 NOTE — Discharge Instructions (Addendum)
-  As we discussed your back problem is not going to get better with medication alone and very unlikely without surgery. It is very concerning to have weakness and falls. This indicates that the condition is worsening and surgery needs to happen sooner to preserve function and keep things from worsening. You may follow up with the neurosurgery office that you have been seeing or discuss a referral to other neurosurgery or orthopedic surgery with your PCP in 2 days.  -Continue gabapentin and robaxin. I sent a few days of hydrocodone  -Keep follow up with PCP  Provider Department Center   07/20/2024 10:00 AM (Arrive by 9:45 AM) Kotturi, Vinay K, MD [facility] at [facility] [address]    -If you have loss of bowel/bladder control -Increased weakness of legs or repeated falls before seeing PCP go to ER or call 911.

## 2024-07-18 NOTE — ED Provider Notes (Signed)
 [facility]    CSN: [id number] Arrival date & time: 07/18/24  1034      History   Chief Complaint Chief Complaint  Patient presents with   Numbness    HPI Johnny Gonzales is a 63 y.o. male with history of chronic back pain presents for worsening lower back pain, numbness/tingling/weakness  of lower extremities.   Patient has been followed by [facility] in [place], [place]. He had an MRI on 07/05/24. Seen in ED on 07/12/24 and prescribed gabapentin and robaxin. Patient says he has been in such bad pain that he has taken it all. He says he is not sure what the next steps are but says he would like to have surgery if there are no other options. He says he has been using a walker to get around recently. He also reports numbness and pain in the groin. He says he tripped over his own feet and fell a few days ago. Reports increased pain with prolonged sitting or standing. No loss of bowel/bladder control. He has an appointment with PCP in 2 days.   HPI  Past Medical History:  Diagnosis Date   Back pain     Patient Active Problem List   Diagnosis Date Noted   Perineal abscess 05/17/2015    Past Surgical History:  Procedure Laterality Date   HAND SURGERY     INCISION AND DRAINAGE ABSCESS N/A 04/30/2015   Procedure: INCISION AND DRAINAGE ABSCESS/PERINEAL ABSCESS;  Surgeon: Rosina Riis, MD;  Location: [facility] ORS;  Service: Urology;  Laterality: N/A;       Home Medications    Prior to Admission medications   Medication Sig Start Date End Date Taking? Authorizing Provider  gabapentin (NEURONTIN) 100 MG capsule Take 300 mg by mouth. 07/07/24 08/06/24 Yes [provider]  HYDROcodone -acetaminophen  (NORCO) 10-325 MG tablet Take 1 tablet by mouth every 8 (eight) hours as needed for up to 5 days for severe pain (pain score 7-10). 07/18/24 07/23/24 Yes Arvis Huxley B, PA-C  methocarbamol (ROBAXIN) 500 MG tablet Take by mouth. 07/12/24  Yes [provider]  Aspirin -Salicylamide-Caffeine (BC HEADACHE POWDER PO) Take 1 packet by mouth daily as needed (pain and stiffness.).    [provider]    Family History Family History  Problem Relation Age of Onset   Stroke Father    Heart attack Brother    Heart attack Mother    Bladder Cancer Neg Hx    Prostate cancer Neg Hx    Kidney cancer Neg Hx     Social History Social History   Tobacco Use   Smoking status: Every Day    Current packs/day: 2.00    Types: Cigarettes   Smokeless tobacco: Never  Vaping Use   Vaping status: Never Used  Substance Use Topics   Alcohol use: Not Currently    Alcohol/week: 2.0 standard drinks of alcohol    Types: 2 Cans of beer per week   Drug use: Not Currently    Types: Cocaine     Allergies   Patient has no known allergies.   Review of Systems Review of Systems  Genitourinary:  Negative for difficulty urinating, enuresis and penile pain.  Musculoskeletal:  Positive for arthralgias, back pain and gait problem.  Neurological:  Positive for weakness and numbness.     Physical Exam Triage Vital Signs ED Triage Vitals  Encounter Vitals Group     BP 07/18/24 1056 133/77     Girls Systolic  BP Percentile --      Girls Diastolic BP Percentile --      Boys Systolic BP Percentile --      Boys Diastolic BP Percentile --      Pulse Rate 07/18/24 1056 79     Resp 07/18/24 1056 18     Temp 07/18/24 1056 98.5 F (36.9 C)     Temp Source 07/18/24 1056 Oral     SpO2 07/18/24 1056 96 %     Weight --      Height --      Head Circumference --      Peak Flow --      Pain Score 07/18/24 1054 6     Pain Loc --      Pain Education --      Exclude from Growth Chart --    No data found.  Updated Vital Signs BP 133/77 (BP Location: Right Arm)   Pulse 79   Temp 98.5 F (36.9 C) (Oral)   Resp 18   SpO2 97%      Physical Exam Vitals and nursing note reviewed.  Constitutional:      General: He is not in acute distress.     Appearance: He is well-developed.  HENT:     Head: Normocephalic and atraumatic.  Eyes:     Conjunctiva/sclera: Conjunctivae normal.  Cardiovascular:     Rate and Rhythm: Normal rate and regular rhythm.  Pulmonary:     Effort: Pulmonary effort is normal. No respiratory distress.     Breath sounds: Normal breath sounds.  Musculoskeletal:     Cervical back: Neck supple.     Lumbar back: Tenderness (right paralumbar muscles) and bony tenderness (L4-L5, L5-S1) present. Decreased range of motion. Positive right straight leg raise test and positive left straight leg raise test.     Comments: Mild weakness of bilateral lower extremities but most weakness with bilateral knee flexion  Skin:    General: Skin is warm and dry.     Capillary Refill: Capillary refill takes less than 2 seconds.  Neurological:     General: No focal deficit present.     Mental Status: He is alert. Mental status is at baseline.     Motor: No weakness.     Gait: Gait abnormal (Slow shuffling gait with use of a walker).  Psychiatric:        Mood and Affect: Mood normal.        Behavior: Behavior normal.      UC Treatments / Results  Labs (all labs ordered are listed, but only abnormal results are displayed) Labs Reviewed - No data to display  EKG   Radiology No results found. Imaging Results - MRI Lumbar Spine Wo Contrast (07/04/2024 7:11 PM EDT) Narrative  07/05/2024 3:50 PM EDT  EXAM: Magnetic resonance imaging, spinal canal and contents, lumbar, without contrast material. DATE: 07/04/2024 7:11 PM ACCESSION: [id number] JV DICTATED: 07/05/2024 9:10 AM INTERPRETATION LOCATION: [facility]  CLINICAL INDICATION: 64 years old Male with Lumbar spinal stenosis with neurogenic claudication greater than 6 weeks, siginificantly impaired mobility  - M48.062 - Spinal stenosis of lumbar region with neurogenic claudication - M47.816 - Lumbar spondylosis    COMPARISON: Lumbar spine radiograph  05/05/2024  TECHNIQUE: Multiplanar MRI was performed through the lumbar spine without intravenous contrast.  FINDINGS: Heterogeneous bone marrow signal intensity, likely degenerative in nature. Multilevel degenerative endplate signal changes most pronounced at the L2-L3 and L4-L5 upon sagittal views, T1 and T2 hyperintense foci  within the spinal cord at the level of the L2 vertebral body. The area is isointense on the STIR sequences. The findings do not persist on axial views and favored to represent volume averaging/artifact. Otherwise, the visualized cord is unremarkable and the conus medullaris ends at a normal level.  Mild dextrocurvature of the lumbar spine with apex at L2-L3. Trace anterolisthesis of L1 on L2 and L3 on L4. Grade 1 retrolisthesis of L5 on S1. Multilevel disc desiccation, prominent at L5-S1.  T12-L1: Mild disc bulge. Facet arthropathy with ligamentum flavum thickening. No significant spinal canal or neural foramen narrowing.  L1-L2: Diffuse disc bulge and endplate osteophytosis. Facet arthropathy and ligamentum flavum thickening. Prominent posterior epidural fat. Overall resulting in moderate spinal canal narrowing. Moderate right, severe left neural foraminal narrowing.  L2-L3: Diffuse circumferential disc bulge, endplate osteophytosis, ligamentum flavum thickening and facet arthropathy with associated hypertrophic epidural fat resulting in moderate spinal canal narrowing. Mild right, moderate left neural foraminal narrowing.  L3-L4: Diffuse circumferential disc bulge, endplate osteophytosis, ligamentum flavum thickening and facet arthropathy with associated hypertrophic epidural fat resulting in severe spinal canal narrowing. Moderate to severe right, severe left neural foraminal narrowing.  L4-L5: Diffuse circumferential disc bulge, endplate osteophytosis, facet arthropathy and mild ligamentum flavum thickening with superimposed hypertrophic epidural fat resulting in moderate  spinal canal narrowing. Moderate right, mild left neural foraminal narrowing.  L5-S1: Mild disc bulge, retrolisthesis, facet hypertrophy resulting in mild spinal canal narrowing. Moderate to severe right, moderate left neural foraminal narrowing.  The paraspinal tissues are within normal limits.  For the purposes of this dictation, the lowest well formed intervertebral disc space is assumed to be the L5-S1 level, and there are presumed to be five lumbar-type vertebral bodies.     Procedures Procedures (including critical care time)  Medications Ordered in UC Medications - No data to display  Initial Impression / Assessment and Plan / UC Course  I have reviewed the triage vital signs and the nursing notes.  Pertinent labs & imaging results that were available during my care of the patient were reviewed by me and considered in my medical decision making (see chart for details).   63 y/o  male with history of chronic back pain, spinal stenosis, and neurogenic claudication presents for back pain an numbness/tingling/weakness of both lower extremities. Patient has been following up with neurosurgery in [facility]. Has been taking gabapentin and robaxin for the past week without relief.   Vitals are stable and normal. He is walking with a walker very slowly. He has tenderness of the right paralumbar muscles and L4-L5 L5-S1. Reduced ROM of back. Weakness of both extremities equally. Most weakness with knee flexion.   Denis loss of bowel/bladder control but does report falls recently.   Reviewed notes from [facility]. Reviewed MRI results and included above.   Advised patient that he will need to follow up with neurosurgery. He thinks he might want to be seen by a different clinic. Has appointment with PCP in 2 days. Advised discussing referral at that time. Sent Norco as needed for severe pain. Reviewed going to ED if loss of bowel/bladder control, increased weakness or repeated falls. I  explained to patient that he will very likely need surgery to improve. Patient appreciated me explaining this to him.    Final Clinical Impressions(s) / UC Diagnoses   Final diagnoses:  Chronic bilateral low back pain with bilateral sciatica  Spinal stenosis, lumbar region, with neurogenic claudication     Discharge Instructions      -  As we discussed your back problem is not going to get better with medication alone and very unlikely without surgery. It is very concerning to have weakness and falls. This indicates that the condition is worsening and surgery needs to happen sooner to preserve function and keep things from worsening. You may follow up with the neurosurgery office that you have been seeing or discuss a referral to other neurosurgery or orthopedic surgery with your PCP in 2 days.  -Continue gabapentin and robaxin. I sent a few days of hydrocodone  -Keep follow up with PCP  Provider Department Center   07/20/2024 10:00 AM (Arrive by 9:45 AM) Kotturi, Vinay K, MD [facility] [address]    -If you have loss of bowel/bladder control -Increased weakness of legs or repeated falls before seeing PCP go to ER or call 911.     ED Prescriptions     Medication Sig Dispense Auth. Provider   HYDROcodone -acetaminophen  (NORCO) 10-325 MG tablet Take 1 tablet by mouth every 8 (eight) hours as needed for up to 5 days for severe pain (pain score 7-10). 15 tablet Arvis Jolan NOVAK, PA-C      I have reviewed the PDMP during this encounter.   Arvis Jolan NOVAK, PA-C 07/18/24 1256

## 2024-07-20 ENCOUNTER — Telehealth: Payer: Self-pay | Admitting: General Practice

## 2024-07-20 ENCOUNTER — Ambulatory Visit (INDEPENDENT_AMBULATORY_CARE_PROVIDER_SITE_OTHER): Payer: Self-pay | Admitting: Family Medicine

## 2024-07-20 ENCOUNTER — Encounter: Payer: Self-pay | Admitting: Family Medicine

## 2024-07-20 VITALS — BP 126/72 | HR 66 | Ht 69.0 in | Wt 249.0 lb

## 2024-07-20 DIAGNOSIS — R7303 Prediabetes: Secondary | ICD-10-CM | POA: Diagnosis not present

## 2024-07-20 DIAGNOSIS — M47816 Spondylosis without myelopathy or radiculopathy, lumbar region: Secondary | ICD-10-CM

## 2024-07-20 DIAGNOSIS — Z125 Encounter for screening for malignant neoplasm of prostate: Secondary | ICD-10-CM

## 2024-07-20 DIAGNOSIS — Z13220 Encounter for screening for lipoid disorders: Secondary | ICD-10-CM

## 2024-07-20 DIAGNOSIS — Z1211 Encounter for screening for malignant neoplasm of colon: Secondary | ICD-10-CM | POA: Diagnosis not present

## 2024-07-20 DIAGNOSIS — Z136 Encounter for screening for cardiovascular disorders: Secondary | ICD-10-CM

## 2024-07-20 MED ORDER — METFORMIN HCL 500 MG PO TABS: 500.0000 mg | ORAL_TABLET | Freq: Two times a day (BID) | ORAL | 3 refills | Status: DC

## 2024-07-20 MED ORDER — MELOXICAM 15 MG PO TABS: 15.0000 mg | ORAL_TABLET | Freq: Every day | ORAL | 0 refills | Status: DC

## 2024-07-20 MED ORDER — DULOXETINE HCL 20 MG PO CPEP: 20.0000 mg | ORAL_CAPSULE | Freq: Every day | ORAL | 1 refills | Status: DC

## 2024-07-20 MED ORDER — METHOCARBAMOL 500 MG PO TABS: 500.0000 mg | ORAL_TABLET | Freq: Three times a day (TID) | ORAL | 1 refills | Status: DC

## 2024-07-20 MED ORDER — METFORMIN HCL 500 MG PO TABS: 500.0000 mg | ORAL_TABLET | Freq: Two times a day (BID) | ORAL | 3 refills | Status: AC

## 2024-07-20 MED ORDER — MELOXICAM 15 MG PO TABS: 15.0000 mg | ORAL_TABLET | Freq: Every day | ORAL | 1 refills | Status: DC

## 2024-07-20 NOTE — Telephone Encounter (Signed)
 Patient came back in and [facility] [place]. [address] Rx got sent to [facility]. Please send medications to [facility] [place].

## 2024-07-20 NOTE — Addendum Note (Signed)
 Addended by: SOL MACKEY SOR K on: 07/20/2024 11:55 AM   Modules accepted: Orders

## 2024-07-20 NOTE — Telephone Encounter (Signed)
 Please review

## 2024-07-20 NOTE — Progress Notes (Signed)
 New Patient Office Visit  Subjective    Patient ID: Johnny Gonzales, male    DOB: 1960-12-09  Age: 63 y.o. MRN: [id number]  CC:  Chief Complaint  Patient presents with   Establish Care    Patient is here to establish care, chronic back pain, feet burn and has the sensation of rocks under his feet    Referral    Spine surgery - his insurance changed and needs a new referral      Assessment & Plan:   Lumbar spondylosis -     Ambulatory referral to Neurosurgery -     Meloxicam ; Take 1 tablet (15 mg total) by mouth daily.  Dispense: 30 tablet; Refill: 0 -     Methocarbamol; Take 1 tablet (500 mg total) by mouth 3 (three) times daily.  Dispense: 90 tablet; Refill: 1  Prediabetes -     metFORMIN HCl; Take 1 tablet (500 mg total) by mouth 2 (two) times daily with a meal.  Dispense: 180 tablet; Refill: 3  Screening for colon cancer -     Ambulatory referral to Gastroenterology  Encounter for lipid screening for cardiovascular disease -     Lipid panel  Screening for prostate cancer -     PSA  Other orders -     DULoxetine HCl; Take 1 capsule (20 mg total) by mouth daily.  Dispense: 90 capsule; Refill: 1   Assessment and Plan Assessment & Plan Lumbar spondylosis with chronic back pain Chronic lumbar spondylosis with significant spinal degeneration causing back pain and leg symptoms. Pain managed with non-narcotic options due to insurance delays. - Refer to neurosurgery for evaluation and management. - Prescribe Cymbalta daily for chronic pain. - Prescribe meloxicam  as needed for moderate pain, cautioning about renal effects and hypertension. - Advise acetaminophen  for mild pain. - Discussed risks of narcotic use and advised against it. - Prescribe Robaxin as a muscle relaxant.  Prediabetes A1c of 6.4 indicates prediabetes. Discussed lifestyle factors and emphasized dietary changes and medication to prevent diabetes progression. - Prescribe metformin, starting with one pill in  the evening with meals. - Advise dietary modifications, avoiding sugary drinks and simple carbohydrates. - Encourage weight loss and increased physical activity.  General Health Maintenance Overdue for routine screenings. Elevated blood pressure noted. No recent cholesterol check. - Order colonoscopy for colorectal cancer screening. - Order blood work to check cholesterol levels. - Advise weight loss to manage blood pressure.    Return in about 1 month (around 08/20/2024) for chronic follow up with PCP.   Vinary K Maicee Ullman, MD   HPI   Johnny Gonzales presents to establish care Discussed the use of AI scribe software for clinical note transcription with the patient, who gave verbal consent to proceed.  History of Present Illness Johnny Gonzales is a 63 year old male who presents to establish care and requesting referral for Spine specialist, He has hx of back pain and prediabetes.  He has a history of left knee surgery performed in August of the previous year. Post-surgery, he was unable to undergo physical therapy initially due to lack of insurance. Upon returning to work at [employer] in [place], he experienced worsening knee pain and began using a cane. Despite obtaining insurance later, he continued to experience significant pain and was informed by his orthopedic doctor that his back, rather than his knee, was the primary issue.  He describes persistent back pain that has progressively worsened, impacting his ability to walk and perform his job.  He uses a walker and experiences burning and stinging sensations in his legs and feet, which he attributes to his back condition. He has undergone x-rays and consultations with multiple doctors, who suggested various treatment options, including surgery, but his treatment was interrupted due to insurance issues.  He has been informed of his prediabetic status with an A1c of 6.4. He has a family history of diabetes, as his brother has the  condition. He wants to modify his diet, particularly reducing soda intake, to manage his blood sugar levels.  No history of high blood pressure or other significant health issues aside from his knee and back problems. He has not seen a primary care physician regularly and is uncertain about his last visit, though he recalls a visit in September.  His current medications include metformin for prediabetes, Cymbalta for back pain, and Mobic  as needed for severe pain. He also uses Robaxin, a muscle relaxer, three times a day.    Outpatient Encounter Medications as of 07/20/2024  Medication Sig   aspirin  EC 81 MG tablet Take 81 mg by mouth daily.   Aspirin -Salicylamide-Caffeine (BC HEADACHE POWDER PO) Take 1 packet by mouth daily as needed (pain and stiffness.).   DULoxetine (CYMBALTA) 20 MG capsule Take 1 capsule (20 mg total) by mouth daily.   HYDROcodone -acetaminophen  (NORCO) 10-325 MG tablet Take 1 tablet by mouth every 8 (eight) hours as needed for up to 5 days for severe pain (pain score 7-10).   meloxicam  (MOBIC ) 15 MG tablet Take 1 tablet (15 mg total) by mouth daily.   metFORMIN (GLUCOPHAGE) 500 MG tablet Take 1 tablet (500 mg total) by mouth 2 (two) times daily with a meal.   [DISCONTINUED] methocarbamol (ROBAXIN) 500 MG tablet Take by mouth.   gabapentin (NEURONTIN) 100 MG capsule Take 300 mg by mouth. (Patient not taking: Reported on 07/20/2024)   methocarbamol (ROBAXIN) 500 MG tablet Take 1 tablet (500 mg total) by mouth 3 (three) times daily.   No facility-administered encounter medications on file as of 07/20/2024.      Review of Systems  All other systems reviewed and are negative.       Objective    BP 126/72   Pulse 66   Ht 5' 9 (1.753 m)   Wt 249 lb (112.9 kg)   SpO2 98%   BMI 36.77 kg/m   Physical Exam Vitals and nursing note reviewed.  Constitutional:      Appearance: Normal appearance.  HENT:     Head: Normocephalic.     Right Ear: External ear normal.      Left Ear: External ear normal.  Eyes:     Conjunctiva/sclera: Conjunctivae normal.  Cardiovascular:     Rate and Rhythm: Normal rate.  Pulmonary:     Effort: Pulmonary effort is normal. No respiratory distress.  Abdominal:     Palpations: Abdomen is soft.  Musculoskeletal:        General: Normal range of motion.  Skin:    General: Skin is warm.  Neurological:     Mental Status: He is alert and oriented to person, place, and time.  Psychiatric:        Mood and Affect: Mood normal.

## 2024-07-20 NOTE — Addendum Note (Signed)
 Addended by: SOL MACKEY SOR K on: 07/20/2024 11:14 AM   Modules accepted: Level of Service

## 2024-07-21 ENCOUNTER — Telehealth: Payer: Self-pay | Admitting: Family Medicine

## 2024-07-21 ENCOUNTER — Ambulatory Visit: Payer: Self-pay | Admitting: Family Medicine

## 2024-07-21 ENCOUNTER — Other Ambulatory Visit: Payer: Self-pay | Admitting: Family Medicine

## 2024-07-21 DIAGNOSIS — E782 Mixed hyperlipidemia: Secondary | ICD-10-CM

## 2024-07-21 LAB — LIPID PANEL
Chol/HDL Ratio: 6.2 ratio — ABNORMAL HIGH (ref 0.0–5.0)
Cholesterol, Total: 211 mg/dL — ABNORMAL HIGH (ref 100–199)
HDL: 34 mg/dL — ABNORMAL LOW (ref >39)
LDL Chol Calc (NIH): 154 mg/dL — ABNORMAL HIGH (ref 0–99)
Triglycerides: 126 mg/dL (ref 0–149)
VLDL Cholesterol Cal: 23 mg/dL (ref 5–40)

## 2024-07-21 LAB — PSA: Prostate Specific Ag, Serum: 2.9 ng/mL (ref 0.0–4.0)

## 2024-07-21 MED ORDER — ROSUVASTATIN CALCIUM 5 MG PO TABS: 5.0000 mg | ORAL_TABLET | Freq: Every day | ORAL | 3 refills | Status: DC

## 2024-07-21 NOTE — Progress Notes (Signed)
 Your bad cholesterol is elevated recommend low fat diet and exercise. Risk for having heart attack stroke is above 16.4% recommend starting cholesterol medication please let me know if you want me to call in Lipitor.  Common side effects is muscle aches.  The 10-year ASCVD risk score (Arnett DK, et al., 2019) is: 16.4%

## 2024-07-21 NOTE — Progress Notes (Signed)
 Crestor Rx sent.

## 2024-07-21 NOTE — Telephone Encounter (Unsigned)
 Copied from CRM [id number]. Topic: General - Other >> Jul 21, 2024 11:39 AM Tiffini S wrote: Reason for CRM:  Patient states that he is receiving phone calls for the Neurosurgery for his spine. Cannot hear during the call to there location  Please call the patient to follow up about referral at  [phone].

## 2024-07-26 ENCOUNTER — Encounter: Payer: Self-pay | Admitting: Family Medicine

## 2024-07-26 NOTE — Telephone Encounter (Signed)
 Please review patient's message:

## 2024-07-27 ENCOUNTER — Ambulatory Visit: Payer: Self-pay

## 2024-07-27 NOTE — Telephone Encounter (Signed)
 FYI Only or Action Required?: FYI only for provider.  Patient was last seen in primary care on 07/20/2024 by Kotturi, Vinay K, MD.  Called Nurse Triage reporting Foot Pain.  Symptoms began several months ago worsening today.  Interventions attempted: Rest, hydration, or home remedies.  Symptoms are: rapidly worsening.  Triage Disposition: See Physician Within 24 Hours  Patient/caregiver understands and will follow disposition?: Yes    Copied from CRM #[id number]. Topic: Clinical - Red Word Triage >> Jul 27, 2024  3:11 PM Antwanette L wrote: Red Word that prompted transfer to Nurse Triage: The patient is experiencing burning sensations and numbness in the bottoms of both feet and legs, which appears to be related to spinal pressure. Reason for Disposition  Weakness (i.e., loss of strength) of new-onset in foot or toes  (Exceptions: Not truly weak, foot feels weak because of pain; weakness present > 2 weeks.)  Answer Assessment - Initial Assessment Questions 1. ONSET: When did the pain start?      A long time but worse today  2. LOCATION: Where is the pain located?      Bilateral feet and legs  3. PAIN: How bad is the pain?    (Scale 1-10; or mild, moderate, severe)     Burning and tingling sensation bilateral feet and legs, feels this is progressing to numbness 4. WORK OR EXERCISE: Has there been any recent work or exercise that involved this part of the body?       5. CAUSE: What do you think is causing the foot pain?     Spinal pressure but not really sure. He is being follow by neuro, would like to schedule with pcp for worsening tingling and burning.  6. OTHER SYMPTOMS: Do you have any other symptoms? (e.g., leg pain, rash, fever, numbness)      Denies  Protocols used: Foot Pain-A-AH

## 2024-07-27 NOTE — Telephone Encounter (Signed)
 Noted  Pt has appt.  KP

## 2024-07-28 ENCOUNTER — Ambulatory Visit: Admitting: Family Medicine

## 2024-07-28 ENCOUNTER — Encounter: Payer: Self-pay | Admitting: Family Medicine

## 2024-07-28 VITALS — BP 132/80 | HR 76 | Ht 69.0 in | Wt 247.0 lb

## 2024-07-28 DIAGNOSIS — R7303 Prediabetes: Secondary | ICD-10-CM | POA: Diagnosis not present

## 2024-07-28 DIAGNOSIS — M47816 Spondylosis without myelopathy or radiculopathy, lumbar region: Secondary | ICD-10-CM

## 2024-07-28 MED ORDER — PREDNISONE 10 MG PO TABS: ORAL_TABLET | ORAL | 0 refills | Status: DC

## 2024-07-28 NOTE — Progress Notes (Signed)
 Acute Office Visit  Subjective:     Patient ID: Johnny Gonzales, male    DOB: 07-21-1961, 63 y.o.   MRN: [id number]  Chief Complaint  Patient presents with   Leg Pain    Patient describes his legs feeling numb and the bottom of his feet    HPI Patient is in today for  Discussed the use of AI scribe software for clinical note transcription with the patient, who gave verbal consent to proceed.  History of Present Illness Johnny Gonzales is a 63 year old male with degenerative disc disease who presents with worsening numbness and burning in his feet. He is accompanied by his brother.  He has been experiencing worsening numbness and burning in his feet, particularly on the right side, which began yesterday. He describes a sensation of 'junk' under his feet and notes that he could not feel his feet yesterday. There is also numbness in his toes, especially on the right side.  His medical history includes degenerative disc disease with multilevel involvement from L1 to L5, associated with facet arthropathy. A previous MRI showed severe spinal canal narrowing and moderate to severe neuroforaminal narrowing.  He is currently taking Cymbalta, which helps him stand up a little better, and meloxicam , which he has been advised to hold due to gastrointestinal side effects. He also takes metformin, which he suspects is causing diarrhea.  He experiences difficulty walking. He is concerned about his ability to navigate stairs due to his back condition.  No new bowel or bladder incontinence, but he reports occasional urinary incontinence, which is not new. He is able to feel his buttocks area when wiping, indicating intact sensation in that region.    Review of Systems  All other systems reviewed and are negative.       Objective:    BP 132/80   Pulse 76   Ht 5' 9 (1.753 m)   Wt 247 lb (112 kg)   SpO2 95%   BMI 36.48 kg/m     Physical Exam Vitals and nursing note reviewed.   Constitutional:      Appearance: Normal appearance.  HENT:     Head: Normocephalic.     Right Ear: External ear normal.     Left Ear: External ear normal.  Eyes:     Conjunctiva/sclera: Conjunctivae normal.  Cardiovascular:     Rate and Rhythm: Normal rate.  Pulmonary:     Effort: Pulmonary effort is normal. No respiratory distress.  Abdominal:     Palpations: Abdomen is soft.  Musculoskeletal:        General: Normal range of motion.  Skin:    General: Skin is warm.  Neurological:     Mental Status: He is alert and oriented to person, place, and time.  Psychiatric:        Mood and Affect: Mood normal.     No results found for any visits on 07/28/24.      Assessment & Plan:   Problem List Items Addressed This Visit   None Visit Diagnoses       Lumbar spondylosis    -  Primary   Relevant Medications   predniSONE (DELTASONE) 10 MG tablet   Other Relevant Orders   Ambulatory referral to Neurosurgery     Prediabetes           Meds ordered this encounter  Medications   predniSONE (DELTASONE) 10 MG tablet    Sig: Take 6 tablets (60 mg total) by mouth daily with  breakfast for 1 day, THEN 5 tablets (50 mg total) daily with breakfast for 1 day, THEN 4 tablets (40 mg total) daily with breakfast for 1 day, THEN 3 tablets (30 mg total) daily with breakfast for 1 day, THEN 2 tablets (20 mg total) daily with breakfast for 1 day, THEN 1 tablet (10 mg total) daily with breakfast for 1 day.    Dispense:  21 tablet    Refill:  0   Assessment and Plan Assessment & Plan Lumbar degenerative disc disease with multilevel spinal stenosis and associated chronic pain Chronic lumbar degenerative disc disease with multilevel spinal stenosis at L3-L4 causing severe pain and mobility issues. Cymbalta provides partial relief. Meloxicam  likely causing gastrointestinal side effects. MRI confirmed diagnosis without severe spinal cord compression. Neurosurgery referral pending. - Order MRI of  the lumbar spine. - Prescribe short course of prednisone. - Discontinue meloxicam . - Refer to neurosurgery for evaluation and potential surgery or physical therapy. - Advise use of heating pad and lidocaine  patches for pain relief. - Provide letter for housing accommodation due to back pain. - Instruct to go to ER if symptoms worsen over the weekend.  Prediabetes managed with metformin, causing gastrointestinal side effects. Currently taking metformin once daily with meals. - Hold metformin temporarily to assess symptom improvement. - Restart metformin after a few days, switching to evening dosing with meals. - Advise to take metformin with yogurt or probiotics to mitigate gastrointestinal side effects.   No follow-ups on file.  Vinary K Elenie Coven, MD

## 2024-08-01 ENCOUNTER — Ambulatory Visit: Admitting: Family Medicine

## 2024-08-02 ENCOUNTER — Encounter: Payer: Self-pay | Admitting: Family Medicine

## 2024-08-02 NOTE — Telephone Encounter (Signed)
 Please review patient's message:

## 2024-08-03 ENCOUNTER — Other Ambulatory Visit: Payer: Self-pay | Admitting: Family Medicine

## 2024-08-03 ENCOUNTER — Ambulatory Visit: Payer: Self-pay

## 2024-08-03 DIAGNOSIS — M47816 Spondylosis without myelopathy or radiculopathy, lumbar region: Secondary | ICD-10-CM

## 2024-08-03 MED ORDER — DULOXETINE HCL 40 MG PO CPEP: 40.0000 mg | ORAL_CAPSULE | Freq: Every day | ORAL | 0 refills | Status: AC

## 2024-08-03 MED ORDER — PREDNISONE 20 MG PO TABS: 40.0000 mg | ORAL_TABLET | Freq: Every day | ORAL | 0 refills | Status: AC

## 2024-08-03 MED ORDER — PREDNISONE 10 MG PO TABS
ORAL_TABLET | ORAL | 0 refills | Status: DC
Start: 2024-08-03 — End: 2024-08-03

## 2024-08-03 NOTE — Telephone Encounter (Signed)
 Please review

## 2024-08-03 NOTE — Telephone Encounter (Signed)
 Can you please check if he picked up the one I sent it recently. Its too early for refill. Last day of the course was 10/22.

## 2024-08-03 NOTE — Telephone Encounter (Signed)
 FYI Only or Action Required?: FYI only for provider.  Patient was last seen in primary care on 07/28/2024 by Kotturi, Vinay K, MD.  Called Nurse Triage reporting Hip Pain.  Symptoms began several days ago.  Interventions attempted: Rest, hydration, or home remedies and Ice/heat application.  Symptoms are: stable.  Triage Disposition: Home Care  Patient/caregiver understands and will follow disposition?: Yes Reason for Disposition  Hip pain  Answer Assessment - Initial Assessment Questions Patient states he is on a lot of medication for everything, finds it helpful. Patient states already got MRI done, neurosurgery consultation appointment on 11/10  1. LOCATION and RADIATION: Where is the pain located? Does the pain spread (shoot) anywhere else?     Both hips  2. QUALITY: What does the pain feel like?  (e.g., sharp, dull, aching, burning)     Pinching  3. SEVERITY: How bad is the pain? What does it keep you from doing?   (Scale 1-10; or mild, moderate, severe)     5-6/10  4. ONSET: When did the pain start? Does it come and go, or is it there all the time?     2 days ago  5. WORK OR EXERCISE: Has there been any recent work or exercise that involved this part of the body?      Denies  6. CAUSE: What do you think is causing the hip pain?      Unsure  7. AGGRAVATING FACTORS: What makes the hip pain worse? (e.g., walking, climbing stairs, running)    Unsure  8. OTHER SYMPTOMS: Do you have any other symptoms? (e.g., back pain, pain shooting down leg,  fever, rash)     Numbness prominent in right leg, states been going on  Protocols used: Hip Pain-A-AH  Copied from CRM #[id number]. Topic: Clinical - Red Word Triage >> Aug 03, 2024  9:50 AM Antwanette L wrote: Red Word that prompted transfer to Nurse Triage: Patient reports running out of prednisone earlier than expected and is requesting a refill. Additionally, patient is experiencing stinging and burning  sensations on both sides of the body.

## 2024-08-04 ENCOUNTER — Other Ambulatory Visit: Payer: Self-pay

## 2024-08-04 ENCOUNTER — Telehealth: Payer: Self-pay

## 2024-08-04 DIAGNOSIS — Z1211 Encounter for screening for malignant neoplasm of colon: Secondary | ICD-10-CM

## 2024-08-04 MED ORDER — NA SULFATE-K SULFATE-MG SULF 17.5-3.13-1.6 GM/177ML PO SOLN: 1.0000 | Freq: Once | ORAL | 0 refills | Status: AC

## 2024-08-04 NOTE — Telephone Encounter (Signed)
 Gastroenterology Pre-Procedure Review  Request Date: 10/18/24 Requesting Physician: Dr. Melany  PATIENT REVIEW QUESTIONS: The patient responded to the following health history questions as indicated:    1. Are you having any GI issues? no 2. Do you have a personal history of Polyps? no 3. Do you have a family history of Colon Cancer or Polyps? no 4. Diabetes Mellitus? Prediabetic takes metformin has been advised verbally and noted on instructions to stop 2 days prior to colonoscopy 5. Joint replacements in the past 12 months?no 6. Major health problems in the past 3 months?no 7. Any artificial heart valves, MVP, or defibrillator?no    MEDICATIONS & ALLERGIES:    Patient reports the following regarding taking any anticoagulation/antiplatelet therapy:   Plavix, Coumadin, Eliquis, Xarelto, Lovenox, Pradaxa, Brilinta, or Effient? no Aspirin ? no  Patient confirms/reports the following medications:  Current Outpatient Medications  Medication Sig Dispense Refill   predniSONE (DELTASONE) 20 MG tablet Take 2 tablets (40 mg total) by mouth daily for 5 days. 10 tablet 0   aspirin  EC 81 MG tablet Take 81 mg by mouth daily. (Patient not taking: Reported on 07/28/2024)     Aspirin -Salicylamide-Caffeine (BC HEADACHE POWDER PO) Take 1 packet by mouth daily as needed (pain and stiffness.).     DULoxetine 40 MG CPEP Take 1 capsule (40 mg total) by mouth daily. 30 capsule 0   gabapentin (NEURONTIN) 100 MG capsule Take 300 mg by mouth. (Patient not taking: Reported on 07/28/2024)     meloxicam  (MOBIC ) 15 MG tablet Take 1 tablet (15 mg total) by mouth daily. 30 tablet 1   metFORMIN (GLUCOPHAGE) 500 MG tablet Take 1 tablet (500 mg total) by mouth 2 (two) times daily with a meal. 180 tablet 3   methocarbamol (ROBAXIN) 500 MG tablet Take 1 tablet (500 mg total) by mouth 3 (three) times daily. 90 tablet 1   rosuvastatin (CRESTOR) 5 MG tablet Take 1 tablet (5 mg total) by mouth daily. 90 tablet 3   No current  facility-administered medications for this visit.    Patient confirms/reports the following allergies:  No Known Allergies  No orders of the defined types were placed in this encounter.   AUTHORIZATION INFORMATION Primary Insurance: 1D#: Group #:  Secondary Insurance: 1D#: Group #:  SCHEDULE INFORMATION: Date: 10/18/24 Time: Location: [facility]

## 2024-08-08 NOTE — Progress Notes (Unsigned)
 Referring Physician:  Kotturi, Vinay K, MD [address] [place],  [place] [address]  Primary Physician:  Kotturi, Vinay K, MD  History of Present Illness: 08/10/2024 [name] is here today with a chief complaint of acute on chronic back pain.  He has had back pain for quite some time, but over the past several months he is gotten rapidly, progressively worse.  He is having significant pain that radiates down both legs.  He feels as though it is the entirety of both legs with burning sensation, numbness, tingling down to his feet.  He now has weakness and newly had a start walking with a walker secondary to inability to ambulate well.  His legs bother him more than his back.  He does have minimal relief from leaning forward.  Feels as though his right leg is worse than the left.  It is constant and sharp.  He states that he has had some numbness at his anus for several months now.  Denies any incontinence.    Weakness: none  Bowel/Bladder Dysfunction: none  Conservative measures:  Physical therapy: Has not participated in PT. Multimodal medical therapy including regular antiinflammatories: Meloxicam , Methocarbamol, Cymbalta,  prednisone Injections: No epidural steroid injections.   The symptoms are causing a significant impact on the patient's life. There is a concern for thoracic and cervical myelopathy.  Review of Systems:  A 10 point review of systems is negative, except for the pertinent positives and negatives detailed in the HPI.  Past Medical History: Past Medical History:  Diagnosis Date   Back pain     Past Surgical History: Past Surgical History:  Procedure Laterality Date   HAND SURGERY     INCISION AND DRAINAGE ABSCESS N/A 04/30/2015   Procedure: INCISION AND DRAINAGE ABSCESS/PERINEAL ABSCESS;  Surgeon: Rosina Riis, MD;  Location: [facility];  Service: Urology;  Laterality: N/A;    Allergies: Allergies as of 08/10/2024   (No Known Allergies)     Medications: Outpatient Encounter Medications as of 08/10/2024  Medication Sig   [EXPIRED] predniSONE (DELTASONE) 20 MG tablet Take 2 tablets (40 mg total) by mouth daily for 5 days.   aspirin  EC 81 MG tablet Take 81 mg by mouth daily. (Patient not taking: Reported on 07/28/2024)   Aspirin -Salicylamide-Caffeine (BC HEADACHE POWDER PO) Take 1 packet by mouth daily as needed (pain and stiffness.).   DULoxetine 40 MG CPEP Take 1 capsule (40 mg total) by mouth daily.   gabapentin (NEURONTIN) 100 MG capsule Take 300 mg by mouth. (Patient not taking: Reported on 07/28/2024)   meloxicam  (MOBIC ) 15 MG tablet Take 1 tablet (15 mg total) by mouth daily.   metFORMIN (GLUCOPHAGE) 500 MG tablet Take 1 tablet (500 mg total) by mouth 2 (two) times daily with a meal.   methocarbamol (ROBAXIN) 500 MG tablet Take 1 tablet (500 mg total) by mouth 3 (three) times daily.   rosuvastatin (CRESTOR) 5 MG tablet Take 1 tablet (5 mg total) by mouth daily.   No facility-administered encounter medications on file as of 08/10/2024.    Social History: Social History   Tobacco Use   Smoking status: Every Day    Current packs/day: 2.00    Types: Cigarettes   Smokeless tobacco: Never  Vaping Use   Vaping status: Never Used  Substance Use Topics   Alcohol use: Not Currently    Alcohol/week: 2.0 standard drinks of alcohol    Types: 2 Cans of beer per week   Drug use:  Not Currently    Types: Cocaine    Family Medical History: Family History  Problem Relation Age of Onset   Stroke Father    Heart attack Brother    Heart attack Mother    Bladder Cancer Neg Hx    Prostate cancer Neg Hx    Kidney cancer Neg Hx     Physical Examination: @VITALWITHPAIN @  General: Patient is well developed, well nourished, calm, collected, and in no apparent distress. Attention to examination is appropriate.  Psychiatric: Patient is non-anxious.  Head:  Pupils equal, round, and reactive to light.  ENT:  Oral mucosa  appears well hydrated.  Neck:   Supple.  Full range of motion.  Respiratory: Patient is breathing without any difficulty.  Extremities: No edema.  Vascular: Palpable dorsal pedal pulses.  Skin:   On exposed skin, there are no abnormal skin lesions.  NEUROLOGICAL:     Awake, alert, oriented to person, place, and time.  Speech is clear and fluent. Fund of knowledge is appropriate.   Cranial Nerves: Pupils equal round and reactive to light.  Facial tone is symmetric.   ROM of spine: Tenderness to palpation of lumbar paraspinals.    Strength:  Side Iliopsoas Quads Hamstring PF DF EHL  R 4- 4- 4- Unable to perform to confrontation 4- 5  L 4 4- 4- ^ 5 5  3+ BLE, + cross adductor.  Normoreflexic in upper extremities. Questionable clonus, but not sustained. Decree sensation to lower extremities.  Patient is walking with a walker. Medical Decision Making  Imaging: FINDINGS: Heterogeneous bone marrow signal intensity, likely degenerative in nature. Multilevel degenerative endplate signal changes most pronounced at the L2-L3 and L4-L5 upon sagittal views, T1 and T2 hyperintense foci within the spinal cord at the level of the L2 vertebral body. The area is isointense on the STIR sequences. The findings do not persist on axial views and favored to represent volume averaging/artifact. Otherwise, the visualized cord is unremarkable and the conus medullaris ends at a normal level.  Mild dextrocurvature of the lumbar spine with apex at L2-L3. Trace anterolisthesis of L1 on L2 and L3 on L4. Grade 1 retrolisthesis of L5 on S1. Multilevel disc desiccation, prominent at L5-S1.  T12-L1: Mild disc bulge. Facet arthropathy with ligamentum flavum thickening. No significant spinal canal or neural foramen narrowing.  L1-L2: Diffuse disc bulge and endplate osteophytosis. Facet arthropathy and ligamentum flavum thickening. Prominent posterior epidural fat. Overall resulting in moderate spinal canal  narrowing. Moderate right, severe left neural foraminal narrowing.  L2-L3: Diffuse circumferential disc bulge, endplate osteophytosis, ligamentum flavum thickening and facet arthropathy with associated hypertrophic epidural fat resulting in moderate spinal canal narrowing. Mild right, moderate left neural foraminal narrowing.  L3-L4: Diffuse circumferential disc bulge, endplate osteophytosis, ligamentum flavum thickening and facet arthropathy with associated hypertrophic epidural fat resulting in severe spinal canal narrowing. Moderate to severe right, severe left neural foraminal narrowing.  L4-L5: Diffuse circumferential disc bulge, endplate osteophytosis, facet arthropathy and mild ligamentum flavum thickening with superimposed hypertrophic epidural fat resulting in moderate spinal canal narrowing. Moderate right, mild left neural foraminal narrowing.  L5-S1: Mild disc bulge, retrolisthesis, facet hypertrophy resulting in mild spinal canal narrowing. Moderate to severe right, moderate left neural foraminal narrowing.  The paraspinal tissues are within normal limits.  For the purposes of this dictation, the lowest well formed intervertebral disc space is assumed to be the L5-S1 level, and there are presumed to be five lumbar-type vertebral bodies.  IMPRESSION: 1. Multilevel lumbar spondylosis and  degenerative disc disease most prominent at L3-L4 where there is severe spinal canal narrowing, moderate to severe right and severe left neural foraminal narrowing. 2. Additional multilevel moderate spinal canal and moderate to severe neural foraminal narrowing, as detailed.    I have personally reviewed the images and agree with the above interpretation.  Assessment and Plan: Mr. Yamashiro is a pleasant 63 y.o. male with acute on chronic back pain with rapidly progressing weakness of bilateral lower extremities with associated numbness and tingling.  He has significant stenosis in his lumbar spine.   Right leg worse than left and pain in bilateral lower extremities worse than his back.  He is now having to walk with a walker.  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  -Plan for today includes flexion extension of lumbar spine to evaluate for listhesis.  -Due to pathologic reflexes, need to ensure no cervical or thoracic myelopathy is present. MRI has been ordered to evaluate for significant stenosis in his cervical and thoracic spine due to his pathologic reflexes, numbness in bilateral legs and rapidly progressing weakness with changes to his gait. -Concern that patient has had rapid decline over the past couple of months and needs surgical intervention. Once patient completes MRI then we will arrange follow up with surgeons to discuss next steps.    Thank you for involving me in the care of this patient.     Lyle Decamp, PA-C Dept. of Neurosurgery

## 2024-08-10 ENCOUNTER — Ambulatory Visit
Admission: RE | Admit: 2024-08-10 | Discharge: 2024-08-10 | Disposition: A | Payer: Self-pay | Source: Ambulatory Visit | Attending: Physician Assistant | Admitting: Physician Assistant

## 2024-08-10 ENCOUNTER — Other Ambulatory Visit: Payer: Self-pay | Admitting: Family Medicine

## 2024-08-10 ENCOUNTER — Encounter: Payer: Self-pay | Admitting: Physician Assistant

## 2024-08-10 ENCOUNTER — Ambulatory Visit (INDEPENDENT_AMBULATORY_CARE_PROVIDER_SITE_OTHER): Admitting: Physician Assistant

## 2024-08-10 VITALS — BP 128/84 | Wt 239.4 lb

## 2024-08-10 DIAGNOSIS — R200 Anesthesia of skin: Secondary | ICD-10-CM

## 2024-08-10 DIAGNOSIS — Z049 Encounter for examination and observation for unspecified reason: Secondary | ICD-10-CM

## 2024-08-10 DIAGNOSIS — M549 Dorsalgia, unspecified: Secondary | ICD-10-CM

## 2024-08-10 DIAGNOSIS — M48062 Spinal stenosis, lumbar region with neurogenic claudication: Secondary | ICD-10-CM

## 2024-08-10 DIAGNOSIS — M48061 Spinal stenosis, lumbar region without neurogenic claudication: Secondary | ICD-10-CM | POA: Diagnosis not present

## 2024-08-10 DIAGNOSIS — M542 Cervicalgia: Secondary | ICD-10-CM

## 2024-08-10 DIAGNOSIS — R2681 Unsteadiness on feet: Secondary | ICD-10-CM

## 2024-08-10 DIAGNOSIS — R29898 Other symptoms and signs involving the musculoskeletal system: Secondary | ICD-10-CM | POA: Diagnosis not present

## 2024-08-10 DIAGNOSIS — R292 Abnormal reflex: Secondary | ICD-10-CM | POA: Diagnosis not present

## 2024-08-11 NOTE — Addendum Note (Signed)
 Addended by: Macklen Wilhoite W on: 08/11/2024 10:55 AM   Modules accepted: Orders

## 2024-08-11 NOTE — Addendum Note (Signed)
 Addended by: ULIS BOTTCHER on: 08/11/2024 07:19 AM   Modules accepted: Orders

## 2024-08-12 ENCOUNTER — Ambulatory Visit

## 2024-08-12 ENCOUNTER — Emergency Department
Admission: EM | Admit: 2024-08-12 | Discharge: 2024-08-12 | Disposition: A | Discharge status: Home / Self Care | Attending: Emergency Medicine | Admitting: Emergency Medicine

## 2024-08-12 ENCOUNTER — Other Ambulatory Visit: Payer: Self-pay

## 2024-08-12 ENCOUNTER — Ambulatory Visit: Admission: RE | Admit: 2024-08-12 | Source: Ambulatory Visit

## 2024-08-12 ENCOUNTER — Emergency Department

## 2024-08-12 DIAGNOSIS — M545 Low back pain, unspecified: Secondary | ICD-10-CM | POA: Diagnosis present

## 2024-08-12 DIAGNOSIS — M4306 Spondylolysis, lumbar region: Secondary | ICD-10-CM | POA: Insufficient documentation

## 2024-08-12 MED ORDER — PREDNISONE 20 MG PO TABS
60.0000 mg | ORAL_TABLET | Freq: Once | ORAL | Status: AC
  Administered 2024-08-12: 60 mg via ORAL
  Filled 2024-08-12: qty 3

## 2024-08-12 MED ORDER — LIDOCAINE 5 % EX PTCH
1.0000 | MEDICATED_PATCH | CUTANEOUS | Status: DC
  Administered 2024-08-12: 1 via TRANSDERMAL
  Filled 2024-08-12: qty 1

## 2024-08-12 MED ORDER — PREDNISONE 50 MG PO TABS: 50.0000 mg | ORAL_TABLET | Freq: Every day | ORAL | 0 refills | Status: DC

## 2024-08-12 MED ORDER — LIDOCAINE 5 % EX PTCH: 1.0000 | MEDICATED_PATCH | Freq: Two times a day (BID) | CUTANEOUS | 0 refills | Status: AC

## 2024-08-12 NOTE — ED Triage Notes (Signed)
 Pt to ED for chronic lower back pain, worse since 3 months and PCP told him to come to ED to get an xray. Pt is ambulatory with cane, steady gait. States legs are always numb.

## 2024-08-12 NOTE — Discharge Instructions (Signed)
 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  Please take prednisone 1 tablet with breakfast.  Please apply lidocaine  patch on your skin every 12 hours.  Please call neurosurgery and make an appointment for next week..  Please come back to ED or go to your PCP if you have new symptoms or symptoms worsen.  It was a pleasure to help you today.  Ericah Scotto PA-C

## 2024-08-12 NOTE — ED Provider Notes (Signed)
 [facility] Provider Note    Event Date/Time   First MD Initiated Contact with Patient 08/12/24 1702     (approximate)   History   Back Pain    HPI  Johnny Gonzales is a 63 y.o. male    with a past medical history of lumbar spondylosis, chronic bilateral low back pain with bilateral sciatica, spinal stenosis, lumbar region with neurogenic claudication, with no significant past medical history who presents to the ED complaining of back pain. According to the patient, symptoms started 3 months ago with lower back pain.  Patient endorses lower back pain, taking multiple pain medications without solution of the pain, patient denies urinary incontinence, urinary retention, fecal incontinence.  Patient endorses numbness in the medial side of his thighs and legs.  Patient is able to walk with a cane.  Patient was referred by his PCP to ED. No history of recent  trauma.  Per independent chart review, patient had an MRI of lumbar spine without contrast on July 04, 2024, reporting : Multilevel lumbar spondylosis and degenerative disc disease most prominent at L3-L4 where there is severe spinal canal narrowing, moderate to severe right and severe left neural foraminal narrowing. 2.  Additional multilevel moderate spinal canal and moderate to severe neural foraminal narrowing, as detailed. But independent chart review patient is taking aspirin , duloxetine, gabapentin 300 mg by mouth, meloxicam  1 tablet a day, methocarbamol 1 tablet by mouth every 8 hours.        Patient Active Problem List   Diagnosis Date Noted   Perineal abscess 05/17/2015     Physical Exam   Triage Vital Signs: ED Triage Vitals  Encounter Vitals Group     BP 08/12/24 1550 120/82     Girls Systolic BP Percentile --      Girls Diastolic BP Percentile --      Boys Systolic BP Percentile --      Boys Diastolic BP Percentile --      Pulse Rate 08/12/24 1550 80     Resp 08/12/24 1550 20     Temp  08/12/24 1550 98 F (36.7 C)     Temp Source 08/12/24 1550 Oral     SpO2 08/12/24 1550 100 %     Weight 08/12/24 1549 239 lb (108.4 kg)     Height 08/12/24 1549 6' (1.829 m)     Head Circumference --      Peak Flow --      Pain Score 08/12/24 1548 5     Pain Loc --      Pain Education --      Exclude from Growth Chart --     Most recent vital signs: Vitals:   08/12/24 1550 08/12/24 1701  BP: 120/82   Pulse: 80   Resp: 20   Temp: 98 F (36.7 C)   SpO2: 100% 100%     Physical Exam Vitals and nursing note reviewed.  Vital signs during triage were normal.  General:          Awake, no distress.  Patient is in a wheelchair CV:                  Good peripheral perfusion.  Resp:               Normal effort. no tachypnea Abd:                 No distention.  Soft nontender Other:  Lumbar spine: Skin is intact, no ecchymosis no hematomas.  Tenderness to palpation in the medial side of the spine process, tenderness to palpation in right paraspinal muscles. Sensation is intact in the medial side of the thighs and legs.  Patient is able to walk with a cane.  No signs of saddle anesthesia. ED Results / Procedures / Treatments   Labs (all labs ordered are listed, but only abnormal results are displayed) Labs Reviewed - No data to display    RADIOLOGY I independently reviewed and interpreted imaging and agree with radiologists findings.      PROCEDURES:  Critical Care performed:   Procedures   MEDICATIONS ORDERED IN ED: Medications  lidocaine  (LIDODERM ) 5 % 1 patch (has no administration in time range)  predniSONE (DELTASONE) tablet 60 mg (has no administration in time range)   Clinical Course as of 08/12/24 1754  Sat Aug 12, 2024  1737 DG Lumbar Spine 2-3 Views  Multilevel moderate degenerative disc disease with anterior osteophyte formation.   [AE]  1737 DG Lumbar Spine 2-3 Views [AE]    Clinical Course User Index [AE] Janit Kast, PA-C     IMPRESSION / MDM / ASSESSMENT AND PLAN / ED COURSE  I reviewed the triage vital signs and the nursing notes.  Differential diagnosis includes, but is not limited to, lumbar fracture, spondylolysis, spondylolithiasis, unlikely cauda equina  Patient's presentation is most consistent with acute complicated illness / injury requiring diagnostic workup.   Johnny Gonzales is a 63 y.o., male who presents today with history of 3 months of lower lumbar back pain.  Patient endorses having numbness on his lower extremities.  Denies urinary incontinence, urinary retention or fecal incontinence.  Patient is able to walk with a cane.  On a physical exam vital signs are stable, there is tenderness to palpation in the midline of the lumbar process, tenderness to palpation in the right lumbar paraspinal muscles.  Sensations intact.,  No signs of saddle anesthesia.  Patient is able to walk with a cane. Plan Prednisone Referral to neurosurgery Patient's diagnosis is consistent with multilevel lumbar spondylosis. I independently reviewed and interpreted imaging and agree with radiologists findings ruling out fracture or subluxation.  I did not order any labs, physical exam is reassuring.  Patient has a recent MRI from July 04, 2024 diagnosing multilevel lumbar spondylosis, patient was not refer to neurosurgery.  Today patient does not have signs of cauda equina, he is taking muscle relaxant, NSAIDs.  I will order prednisone 3 days treatment, advised the patient to take care of the diet.  I did review the patient's allergies and medications.The patient is in stable and satisfactory condition for discharge home  Patient will be discharged home with prescriptions for lidocaine  patch, prednisone.  Advised patient to continue taking his pain medication prescription.  Patient is to follow up with neurosurgery as needed or otherwise directed. Patient is given ED precautions to return to the ED for any worsening or new  symptoms. Discussed plan of care with patient, answered all of patient's questions, Patient agreeable to plan of care. Advised patient to take medications according to the instructions on the label. Discussed possible side effects of new medications. Patient verbalized understanding.  FINAL CLINICAL IMPRESSION(S) / ED DIAGNOSES   Final diagnoses:  Lumbar spondylolysis     Rx / DC Orders   ED Discharge Orders          Ordered    lidocaine  (LIDODERM ) 5 %  Every 12 hours  08/12/24 1745    predniSONE (DELTASONE) 50 MG tablet  Daily with breakfast        08/12/24 1745             Note:  This document was prepared using Dragon voice recognition software and may include unintentional dictation errors.   Janit Kast, PA-C 08/12/24 1756    Willo Dunnings, MD 08/12/24 1941

## 2024-08-12 NOTE — ED Notes (Signed)
 Pt completely assessed by EDP and set to discharge. No RN assessment performed.  Pt provided discharge instructions and prescription information. Pt was given the opportunity to ask questions and questions were answered.

## 2024-08-16 ENCOUNTER — Ambulatory Visit: Payer: Self-pay | Admitting: Physician Assistant

## 2024-08-16 ENCOUNTER — Ambulatory Visit
Admission: RE | Admit: 2024-08-16 | Discharge: 2024-08-16 | Disposition: A | Discharge status: Home / Self Care | Source: Ambulatory Visit | Attending: Physician Assistant | Admitting: Physician Assistant

## 2024-08-16 ENCOUNTER — Other Ambulatory Visit

## 2024-08-16 DIAGNOSIS — R202 Paresthesia of skin: Secondary | ICD-10-CM | POA: Diagnosis present

## 2024-08-16 DIAGNOSIS — M542 Cervicalgia: Secondary | ICD-10-CM | POA: Diagnosis present

## 2024-08-16 DIAGNOSIS — M48062 Spinal stenosis, lumbar region with neurogenic claudication: Secondary | ICD-10-CM | POA: Diagnosis present

## 2024-08-16 DIAGNOSIS — M549 Dorsalgia, unspecified: Secondary | ICD-10-CM | POA: Insufficient documentation

## 2024-08-16 DIAGNOSIS — R2681 Unsteadiness on feet: Secondary | ICD-10-CM | POA: Diagnosis present

## 2024-08-16 DIAGNOSIS — R200 Anesthesia of skin: Secondary | ICD-10-CM | POA: Insufficient documentation

## 2024-08-17 ENCOUNTER — Ambulatory Visit: Admitting: Neurosurgery

## 2024-08-17 ENCOUNTER — Encounter: Payer: Self-pay | Admitting: Neurosurgery

## 2024-08-17 ENCOUNTER — Other Ambulatory Visit: Payer: Self-pay

## 2024-08-17 VITALS — BP 118/78 | Ht 72.0 in | Wt 239.0 lb

## 2024-08-17 DIAGNOSIS — Z01818 Encounter for other preprocedural examination: Secondary | ICD-10-CM

## 2024-08-17 DIAGNOSIS — M4804 Spinal stenosis, thoracic region: Secondary | ICD-10-CM

## 2024-08-17 DIAGNOSIS — M4714 Other spondylosis with myelopathy, thoracic region: Secondary | ICD-10-CM | POA: Diagnosis not present

## 2024-08-17 DIAGNOSIS — G9589 Other specified diseases of spinal cord: Secondary | ICD-10-CM | POA: Diagnosis not present

## 2024-08-17 NOTE — Patient Instructions (Addendum)
 *Colonoscopy needs to be postponed to 12 weeks after surgery*  Please see below for information in regards to your upcoming surgery:   Planned surgery: Left T0-11 transpedicular discectomy and fusion   Surgery date: 09/13/24 at [facility] (Medical Mall: [address], [place], [place] [address]) - you will find out your arrival time the business day before your surgery.   Pre-op appointment at [facility] Pre-admit Testing: you will receive a call with a date/time for this appointment. If you are scheduled for an in person appointment, Pre-admit Testing is located on the first floor of the [facility] building, [address], [address]. During this appointment, they will advise you which medications you can take the morning of surgery, and which medications you will need to hold for surgery. Labs (such as blood work, EKG) may be done at your pre-op appointment. You are not required to fast for these labs. Should you need to change your pre-op appointment, please call Pre-admit testing at [phone].     Blood thinners:   BC powders: avoid 7 days prior to surgery until 14 days after surgery (due to amount of aspirin )   Diabetes/heart failure/kidney disease/weight loss medications that require an extended hold: Per anesthesia guidelines (due to the increased risk of aspiration caused by delayed gastric emptying):   Metformin: hold for 2 days prior to surgery      Surgical clearance: we will send a clearance form to Dr Sol. They may wish to see you in their office prior to signing the clearance form. If so, they may call you to schedule an appointment.    NSAIDS (Non-steroidal anti-inflammatory drugs): because you are having a fusion, please avoid taking any NSAIDS (examples: ibuprofen , motrin , aleve, naproxen, meloxicam , diclofenac ) for 3 months after surgery. Celebrex is an exception and is OK to take, if prescribed. Tylenol  is not an  NSAID.    Common restrictions after spine surgery: No bending, lifting, or twisting ("BLT"). Avoid lifting objects heavier than 10 pounds for the first 6 weeks after surgery. Where possible, avoid household activities that involve lifting, bending, reaching, pushing, or pulling such as laundry, vacuuming, grocery shopping, and childcare. Try to arrange for help from friends and family for these activities while you heal. Do not drive while taking prescription pain medication. Weeks 6 through 12 after surgery: avoid lifting more than 25 pounds.    X-rays after surgery: Because you are having a fusion or arthroplasty: for appointments after your 2 week follow-up: please arrive our office 30 minutes prior to your appointment for x-rays. This applies to every appointment after your 2 week follow-up. Failure to do so may result in your appointment being rescheduled.    How to contact us :  If you have any questions/concerns before or after surgery, you can reach us  at [phone], or you can send a mychart message. We can be reached by phone or mychart 8am-4pm, Monday-Friday.  *Please note: Calls after 4pm are forwarded to a third party answering service. Mychart messages are not routinely monitored during evenings, weekends, and holidays. Please call our office to contact the answering service for urgent concerns during non-business hours.    If you have FMLA/disability paperwork, please drop it off or fax it to [phone]   Appointments/FMLA & disability paperwork: Reche Hait, & Nichole Registered Nurse/Surgery scheduler: Bryna Razavi, RN & Katie, RN Certified Medical Assistants: Don, CMA, Elenor, CMA, Damien, CMA, & Auston, [place] Physician Assistants: Lyle Decamp, PA-C, Edsel Goods, PA-C &  Glade Boys, PA-C Surgeons: Penne Sharps, MD & Reeves Daisy, MD   [facility] PREADMIT TESTING VISIT and SURGERY INFORMATION SHEET   Now that surgery has been  scheduled you can anticipate several phone calls from [facility] services. A pharmacy technician will call you to verify your current list of medications taken at home.               The Pre-Service Center will call to verify your insurance information and to give you billing estimates and information.             The Preadmit Testing Office will be calling to schedule a visit to obtain information for the anesthesia team and provide instructions on preparation for surgery.  What can you expect for the Preadmit Testing Visit: Appointments may be scheduled in-person or by telephone.  If a telephone visit is scheduled, you may be asked to come into the office to have lab tests or other studies performed.   This visit will not be completed any greater than 14 days prior to your surgery.  If your surgery has been scheduled for a future date, please do not be alarmed if we have not contacted you to schedule an appointment more than a month prior to the surgery date.    Please be prepared to provide the following information during this appointment:            -Personal medical history                                               -Medication and allergy list            -Any history of problems with anesthesia              -Recent lab work or diagnostic studies            -Please notify us  of any needs we should be aware of to provide the best care possible           -You will be provided with instructions on how to prepare for your surgery.    On The Day of Surgery:  You must have a driver to take you home after surgery, you will be asked not to drive for 24 hours following surgery.  Taxi, Gisele and non-medical transport will not be acceptable means of transportation unless you have a responsible individual who will be traveling with you.  Visitors in the surgical area:   2 people will be able to visit you in your room once your preparation for surgery has been completed. During surgery, your  visitors will be asked to wait in the Surgery Waiting Area.  It is not a requirement for them to stay, if they prefer to leave and come back.  Your visitor(s) will be given an update once the surgery has been completed.  No visitors are allowed in the initial recovery room to respect patient privacy and safety.  Once you are more awake and transfer to the secondary recovery area, or are transferred to an inpatient room, visitors will again be able to see you.  To respect and protect your privacy: We will ask on the day of surgery who your driver will be and what the contact number for that individual will be. We will ask if it is okay to  share information with this individual, or if there is an alternative individual that we, or the surgeon, should contact to provide updates and information. If family or friends come to the surgical information desk requesting information about you, who you have not listed with us , no information will be given.   It may be helpful to designate someone as the main contact who will be responsible for updating your other friends and family.    PREADMIT TESTING OFFICE: [phone] SAME DAY SURGERY: [phone] We look forward to caring for you before and throughout the process of your surgery.

## 2024-08-17 NOTE — Progress Notes (Deleted)
 Referring Physician:  Kotturi, Vinay K, MD [address], [address] [place],  [place] [address]  Primary Physician:  Kotturi, Vinay K, MD   History of Present Illness: 08/18/2024  Discuss surgery  History of Present Illness: 08/10/2024 Johnny Gonzales is here today with a chief complaint of acute on chronic back pain.  He has had back pain for quite some time, but over the past several months he is gotten rapidly, progressively worse.  He is having significant pain that radiates down both legs.  He feels as though it is the entirety of both legs with burning sensation, numbness, tingling down to his feet.  He now has weakness and newly had a start walking with a walker secondary to inability to ambulate well.  His legs bother him more than his back.  He does have minimal relief from leaning forward.  Feels as though his right leg is worse than the left.  It is constant and sharp.  He states that he has had some numbness at his anus for several months now.  Denies any incontinence.    Weakness: none  Bowel/Bladder Dysfunction: none  Conservative measures:  Physical therapy: Has not participated in PT. Multimodal medical therapy including regular antiinflammatories: Meloxicam , Methocarbamol, Cymbalta,  prednisone Injections: No epidural steroid injections.   The symptoms are causing a significant impact on the patient's life. There is a concern for thoracic and cervical myelopathy.  Review of Systems:  A 10 point review of systems is negative, except for the pertinent positives and negatives detailed in the HPI.  Past Medical History: Past Medical History:  Diagnosis Date   Back pain     Past Surgical History: Past Surgical History:  Procedure Laterality Date   HAND SURGERY     INCISION AND DRAINAGE ABSCESS N/A 04/30/2015   Procedure: INCISION AND DRAINAGE ABSCESS/PERINEAL ABSCESS;  Surgeon: Rosina Riis, MD;  Location: [facility] ORS;  Service: Urology;  Laterality: N/A;     Allergies: Allergies as of 08/10/2024   (No Known Allergies)    Medications: Outpatient Encounter Medications as of 08/10/2024  Medication Sig   [EXPIRED] predniSONE (DELTASONE) 20 MG tablet Take 2 tablets (40 mg total) by mouth daily for 5 days.   aspirin  EC 81 MG tablet Take 81 mg by mouth daily. (Patient not taking: Reported on 07/28/2024)   Aspirin -Salicylamide-Caffeine (BC HEADACHE POWDER PO) Take 1 packet by mouth daily as needed (pain and stiffness.).   DULoxetine 40 MG CPEP Take 1 capsule (40 mg total) by mouth daily.   gabapentin (NEURONTIN) 100 MG capsule Take 300 mg by mouth. (Patient not taking: Reported on 07/28/2024)   meloxicam  (MOBIC ) 15 MG tablet Take 1 tablet (15 mg total) by mouth daily.   metFORMIN (GLUCOPHAGE) 500 MG tablet Take 1 tablet (500 mg total) by mouth 2 (two) times daily with a meal.   methocarbamol (ROBAXIN) 500 MG tablet Take 1 tablet (500 mg total) by mouth 3 (three) times daily.   rosuvastatin (CRESTOR) 5 MG tablet Take 1 tablet (5 mg total) by mouth daily.   No facility-administered encounter medications on file as of 08/10/2024.    Social History: Social History   Tobacco Use   Smoking status: Every Day    Current packs/day: 2.00    Types: Cigarettes   Smokeless tobacco: Never  Vaping Use   Vaping status: Never Used  Substance Use Topics   Alcohol use: Not Currently    Alcohol/week: 2.0 standard drinks of alcohol    Types:  2 Cans of beer per week   Drug use: Not Currently    Types: Cocaine    Family Medical History: Family History  Problem Relation Age of Onset   Stroke Father    Heart attack Brother    Heart attack Mother    Bladder Cancer Neg Hx    Prostate cancer Neg Hx    Kidney cancer Neg Hx     Physical Examination: @VITALWITHPAIN @  General: Patient is well developed, well nourished, calm, collected, and in no apparent distress. Attention to examination is appropriate.  Psychiatric: Patient is  non-anxious.  Head:  Pupils equal, round, and reactive to light.  ENT:  Oral mucosa appears well hydrated.  Neck:   Supple.  Full range of motion.  Respiratory: Patient is breathing without any difficulty.  Extremities: No edema.  Vascular: Palpable dorsal pedal pulses.  Skin:   On exposed skin, there are no abnormal skin lesions.  NEUROLOGICAL:     Awake, alert, oriented to person, place, and time.  Speech is clear and fluent. Fund of knowledge is appropriate.   Cranial Nerves: Pupils equal round and reactive to light.  Facial tone is symmetric.   ROM of spine: Tenderness to palpation of lumbar paraspinals.    Strength:  Side Iliopsoas Quads Hamstring PF DF EHL  R 4- 4- 4- Unable to perform to confrontation 4- 5  L 4 4- 4- ^ 5 5  3+ BLE, + cross adductor.  Normoreflexic in upper extremities. Questionable clonus, but not sustained. Decree sensation to lower extremities.  Patient is walking with a walker. Medical Decision Making  Imaging: FINDINGS: Heterogeneous bone marrow signal intensity, likely degenerative in nature. Multilevel degenerative endplate signal changes most pronounced at the L2-L3 and L4-L5 upon sagittal views, T1 and T2 hyperintense foci within the spinal cord at the level of the L2 vertebral body. The area is isointense on the STIR sequences. The findings do not persist on axial views and favored to represent volume averaging/artifact. Otherwise, the visualized cord is unremarkable and the conus medullaris ends at a normal level.  Mild dextrocurvature of the lumbar spine with apex at L2-L3. Trace anterolisthesis of L1 on L2 and L3 on L4. Grade 1 retrolisthesis of L5 on S1. Multilevel disc desiccation, prominent at L5-S1.  T12-L1: Mild disc bulge. Facet arthropathy with ligamentum flavum thickening. No significant spinal canal or neural foramen narrowing.  L1-L2: Diffuse disc bulge and endplate osteophytosis. Facet arthropathy and ligamentum flavum  thickening. Prominent posterior epidural fat. Overall resulting in moderate spinal canal narrowing. Moderate right, severe left neural foraminal narrowing.  L2-L3: Diffuse circumferential disc bulge, endplate osteophytosis, ligamentum flavum thickening and facet arthropathy with associated hypertrophic epidural fat resulting in moderate spinal canal narrowing. Mild right, moderate left neural foraminal narrowing.  L3-L4: Diffuse circumferential disc bulge, endplate osteophytosis, ligamentum flavum thickening and facet arthropathy with associated hypertrophic epidural fat resulting in severe spinal canal narrowing. Moderate to severe right, severe left neural foraminal narrowing.  L4-L5: Diffuse circumferential disc bulge, endplate osteophytosis, facet arthropathy and mild ligamentum flavum thickening with superimposed hypertrophic epidural fat resulting in moderate spinal canal narrowing. Moderate right, mild left neural foraminal narrowing.  L5-S1: Mild disc bulge, retrolisthesis, facet hypertrophy resulting in mild spinal canal narrowing. Moderate to severe right, moderate left neural foraminal narrowing.  The paraspinal tissues are within normal limits.  For the purposes of this dictation, the lowest well formed intervertebral disc space is assumed to be the L5-S1 level, and there are presumed to be five  lumbar-type vertebral bodies.  IMPRESSION: 1. Multilevel lumbar spondylosis and degenerative disc disease most prominent at L3-L4 where there is severe spinal canal narrowing, moderate to severe right and severe left neural foraminal narrowing. 2. Additional multilevel moderate spinal canal and moderate to severe neural foraminal narrowing, as detailed.    I have personally reviewed the images and agree with the above interpretation.  Assessment and Plan: Johnny Gonzales is a pleasant 63 y.o. male with acute on chronic back pain with rapidly progressing weakness of bilateral lower extremities with  associated numbness and tingling.  He has significant stenosis in his lumbar spine.  Right leg worse than left and pain in bilateral lower extremities worse than his back.  He is now having to walk with a walker.  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  -Plan for today includes flexion extension of lumbar spine to evaluate for listhesis.  -Due to pathologic reflexes, need to ensure no cervical or thoracic myelopathy is present. MRI has been ordered to evaluate for significant stenosis in his cervical and thoracic spine due to his pathologic reflexes, numbness in bilateral legs and rapidly progressing weakness with changes to his gait. -Concern that patient has had rapid decline over the past couple of months and needs surgical intervention. Once patient completes MRI then we will arrange follow up with surgeons to discuss next steps.    Thank you for involving me in the care of this patient.     Lyle Decamp, PA-C Dept. of Neurosurgery

## 2024-08-17 NOTE — Progress Notes (Signed)
 Referring Physician:  Kotturi, Vinay K, MD [address] [place],  [place] [address]  Primary Physician:  Kotturi, Vinay K, MD  History of Present Illness: 08/17/2024 [name] is here today with a chief complaint of  difficulty walking and numbness in both legs.  He has experienced difficulty walking for six months, with symptoms occasionally worsening. He denies recent trips or falls. He began using a mobility aid a couple of days ago, having previously used a walker. Others have expressed concern about his ability to walk without assistance.  He experiences numbness and weakness in both legs, with varying sensation in different areas.  He has a long history of smoking, approximately 40 years, currently smoking a pack a week.   Discussed the use of AI scribe software for clinical note transcription with the patient, who gave verbal consent to proceed.  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  The symptoms are causing a significant impact on the patient's life.   I have utilized the care everywhere function in epic to review the outside records available from external health systems.  Progress Note from [place], [place] on 08/10/24:  History of Present Illness: 08/10/2024 [name] is here today with a chief complaint of acute on chronic back pain.  He has had back pain for quite some time, but over the past several months he is gotten rapidly, progressively worse.  He is having significant pain that radiates down both legs.  He feels as though it is the entirety of both legs with burning sensation, numbness, tingling down to his feet.  He now has weakness and newly had a start walking with a walker secondary to inability to ambulate well.  His legs bother him more than his back.  He does have minimal relief from leaning forward.  Feels as though his right leg is worse than the left.  It is constant and sharp.  He states that he  has had some numbness at his anus for several months now.  Denies any incontinence.       Weakness: none   Bowel/Bladder Dysfunction: none   Conservative measures:  Physical therapy: Has not participated in PT. Multimodal medical therapy including regular antiinflammatories: Meloxicam , Methocarbamol, Cymbalta,  prednisone Injections: No epidural steroid injections.  Review of Systems:  A 10 point review of systems is negative, except for the pertinent positives and negatives detailed in the HPI.  Past Medical History: Past Medical History:  Diagnosis Date   Back pain     Past Surgical History: Past Surgical History:  Procedure Laterality Date   HAND SURGERY     INCISION AND DRAINAGE ABSCESS N/A 04/30/2015   Procedure: INCISION AND DRAINAGE ABSCESS/PERINEAL ABSCESS;  Surgeon: Rosina Riis, MD;  Location: [facility] ORS;  Service: Urology;  Laterality: N/A;    Allergies: Allergies as of 08/17/2024   (No Known Allergies)    Medications:  Current Outpatient Medications:    aspirin  EC 81 MG tablet, Take 81 mg by mouth daily. (Patient not taking: Reported on 07/28/2024), Disp: , Rfl:    Aspirin -Salicylamide-Caffeine (BC HEADACHE POWDER PO), Take 1 packet by mouth daily as needed (pain and stiffness.)., Disp: , Rfl:    DULoxetine 40 MG CPEP, Take 1 capsule (40 mg total) by mouth daily., Disp: 30 capsule, Rfl: 0   gabapentin (NEURONTIN) 100 MG capsule, Take 300 mg by mouth. (Patient not taking: Reported on 07/28/2024), Disp: , Rfl:    lidocaine  (LIDODERM )  5 %, Place 1 patch onto the skin every 12 (twelve) hours for 5 days. Remove & Discard patch within 12 hours or as directed by MD, Disp: 10 patch, Rfl: 0   meloxicam  (MOBIC ) 15 MG tablet, Take 1 tablet (15 mg total) by mouth daily., Disp: 30 tablet, Rfl: 1   metFORMIN (GLUCOPHAGE) 500 MG tablet, Take 1 tablet (500 mg total) by mouth 2 (two) times daily with a meal., Disp: 180 tablet, Rfl: 3   methocarbamol (ROBAXIN) 500 MG tablet,  Take 1 tablet (500 mg total) by mouth 3 (three) times daily., Disp: 90 tablet, Rfl: 1   predniSONE (DELTASONE) 50 MG tablet, Take 1 tablet (50 mg total) by mouth daily with breakfast., Disp: 3 tablet, Rfl: 0   rosuvastatin (CRESTOR) 5 MG tablet, Take 1 tablet (5 mg total) by mouth daily., Disp: 90 tablet, Rfl: 3  Social History: Social History   Tobacco Use   Smoking status: Every Day    Current packs/day: 2.00    Types: Cigarettes   Smokeless tobacco: Never  Vaping Use   Vaping status: Never Used  Substance Use Topics   Alcohol use: Not Currently    Alcohol/week: 2.0 standard drinks of alcohol    Types: 2 Cans of beer per week   Drug use: Not Currently    Types: Cocaine    Family Medical History: Family History  Problem Relation Age of Onset   Stroke Father    Heart attack Brother    Heart attack Mother    Bladder Cancer Neg Hx    Prostate cancer Neg Hx    Kidney cancer Neg Hx     Physical Examination: There were no vitals filed for this visit.  General: Patient is in no apparent distress. Attention to examination is appropriate.  Neck:   Supple.  Full range of motion.  Respiratory: Patient is breathing without any difficulty.   NEUROLOGICAL:     Awake, alert, oriented to person, place, and time.  Speech is clear and fluent.   Cranial Nerves: Pupils equal round and reactive to light.  Facial tone is symmetric.  Facial sensation is symmetric. Shoulder shrug is symmetric. Tongue protrusion is midline.  There is no pronator drift.  Strength: Side Biceps Triceps Deltoid Interossei Grip Wrist Ext. Wrist Flex.  R 5 5 5 5 5 5 5   L 5 5 5 5 5 5 5    Side Iliopsoas Quads Hamstring PF DF EHL  R 4 4 4  4+ 5 5  L 4- 4 4 4+ 5 5   Reflexes are 1+ and symmetric at the biceps, triceps, brachioradialis, 3+patella and  3+achilles.   Hoffman's is absent.  He has 2 beats of clonus bilaterally  Bilateral upper and lower extremity sensation is symmetric to light touch.   He has  sensation in both of his lower extremities is diminished compared to his upper extremities. No evidence of dysmetria noted.  Gait is abnormal and wide-based.  He is very unsteady.  He cannot perform tandem gait.  He is using a cane.     Medical Decision Making  Imaging: MRI T spine 08/16/2024 IMPRESSION: 1. Severe canal stenosis at T10-T11 with mass effect on the cord and associated cord edema or myelomalacia. 2. Severe foraminal stenosis on the right at C3-C4, C4-C5, and left at C5-C6. At C6-C7, moderate to severe bilateral foraminal stenosis. Multilevel moderate foraminal stenosis in the cervical spine as above. 3. Moderate to severe foraminal stenosis bilaterally at T8-T9, T10-T11. Moderate bilateral  foraminal stenosis at T5-T6. 4. Moderate canal stenosis at C3-C4.  Multilevel mild canal stenosis.   Electronically signed by: Gilmore Molt MD 08/16/2024 02:52 PM EST RP Workstation: [id number]  I have personally reviewed the images and agree with the above interpretation.  Assessment and Plan: Mr. Siefert is a pleasant 63 y.o. 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  He has progressively worsening symptoms.  There is no role for conservative management for thoracic myelopathy with myelomalacia.  I recommend surgical intervention with a left-sided T10-11 transpedicular discectomy and T10-11 instrumentation.  I discussed the planned procedure at length with the patient, including the risks, benefits, alternatives, and indications. The risks discussed include but are not limited to bleeding, infection, need for reoperation, spinal fluid leak, stroke, vision loss, anesthetic complication, coma, paralysis, and even death. I also described in detail that improvement was not guaranteed.  The patient expressed understanding of these risks, and asked that we proceed with surgery. I described the surgery in layman's terms, and  gave ample opportunity for questions, which were answered to the best of my ability.  I spent a total of 30 minutes in this patient's care today. This time was spent reviewing pertinent records including imaging studies, obtaining and confirming history, performing a directed evaluation, formulating and discussing my recommendations, and documenting the visit within the medical record.     Thank you for involving me in the care of this patient.      Chester K. Clois MD, [facility] Neurosurgery

## 2024-08-18 ENCOUNTER — Ambulatory Visit: Admitting: Neurosurgery

## 2024-08-18 ENCOUNTER — Ambulatory Visit: Admitting: Family Medicine

## 2024-08-18 ENCOUNTER — Telehealth: Payer: Self-pay

## 2024-08-18 ENCOUNTER — Encounter: Payer: Self-pay | Admitting: Family Medicine

## 2024-08-18 VITALS — BP 114/68 | HR 87 | Ht 72.0 in | Wt 240.0 lb

## 2024-08-18 DIAGNOSIS — Z23 Encounter for immunization: Secondary | ICD-10-CM

## 2024-08-18 DIAGNOSIS — K5904 Chronic idiopathic constipation: Secondary | ICD-10-CM

## 2024-08-18 DIAGNOSIS — F17210 Nicotine dependence, cigarettes, uncomplicated: Secondary | ICD-10-CM | POA: Diagnosis not present

## 2024-08-18 DIAGNOSIS — Z01818 Encounter for other preprocedural examination: Secondary | ICD-10-CM | POA: Diagnosis not present

## 2024-08-18 DIAGNOSIS — R0602 Shortness of breath: Secondary | ICD-10-CM

## 2024-08-18 MED ORDER — ALBUTEROL SULFATE HFA 108 (90 BASE) MCG/ACT IN AERS: 2.0000 | INHALATION_SPRAY | Freq: Four times a day (QID) | RESPIRATORY_TRACT | 2 refills | Status: AC | PRN

## 2024-08-18 MED ORDER — NICOTINE POLACRILEX 4 MG MT GUM: 4.0000 mg | CHEWING_GUM | OROMUCOSAL | 0 refills | Status: DC | PRN

## 2024-08-18 MED ORDER — SENNA-DOCUSATE SODIUM 8.6-50 MG PO TABS: 1.0000 | ORAL_TABLET | Freq: Every day | ORAL | 1 refills | Status: DC

## 2024-08-18 NOTE — Progress Notes (Signed)
 Established Patient Office Visit  Patient ID: Johnny Gonzales, male    DOB: 1960-11-26  Age: 63 y.o. MRN: [id number] PCP: Haskel Dewalt K, MD  No chief complaint on file.   Subjective:     HPI  Discussed the use of AI scribe software for clinical note transcription with the patient, who gave verbal consent to proceed.  History of Present Illness Kvion Shapley is a 63 year old male who presents for follow-up regarding his lower back pain and upcoming surgery.  He has ongoing lower back pain and reports that imaging studies (x-rays and MRI) were performed by his neurosurgeon, who discussed findings related to narrowing in the lower part of his spine. He is scheduled for back surgery on September 13, 2024, as planned by his neurosurgeon. His current pain level is 5 out of 10; he reports taking a blue pill daily for pain. He completed a course of steroids and reports feeling better after taking them.  He experiences shortness of breath, particularly when taking deep breaths, which he attributes to his back pain. He denies any history of COPD or asthma but acknowledges getting winded easily and having difficulty walking as desired. He smokes half a pack of cigarettes per week.  He reports constipation, which he believes is related to his medication use. He is not taking a stool softener as prescribed. No urinary concerns.  No chest pain, recent cold-like symptoms, or urinary issues. Confirms shortness of breath and difficulty with deep breathing associated with back pain.     Review of Systems  All other systems reviewed and are negative.     Objective:     BP 114/68   Pulse 87   Ht 6' (1.829 m)   Wt 240 lb (108.9 kg)   SpO2 97%   BMI 32.55 kg/m  BP Readings from Last 3 Encounters:  08/18/24 114/68  08/17/24 118/78  08/12/24 120/82      Physical Exam Vitals and nursing note reviewed.  Constitutional:      Appearance: Normal appearance.  HENT:     Head: Normocephalic.      Right Ear: External ear normal.     Left Ear: External ear normal.  Eyes:     Conjunctiva/sclera: Conjunctivae normal.  Cardiovascular:     Rate and Rhythm: Normal rate.  Pulmonary:     Effort: Pulmonary effort is normal. No respiratory distress.  Abdominal:     Palpations: Abdomen is soft.  Musculoskeletal:        General: Normal range of motion.  Skin:    General: Skin is warm.  Neurological:     Mental Status: He is alert and oriented to person, place, and time.  Psychiatric:        Mood and Affect: Mood normal.     Physical Exam     No results found for any visits on 08/18/24.     The 10-year ASCVD risk score (Arnett DK, et al., 2019) is: 13.9%    Assessment & Plan:   Problem List Items Addressed This Visit   None Visit Diagnoses       Chronic idiopathic constipation    -  Primary   Relevant Medications   sennosides-docusate sodium  (SENOKOT-S) 8.6-50 MG tablet     Cigarette smoker       Relevant Medications   nicotine  polacrilex (NICORETTE) 4 MG gum     Shortness of breath       Relevant Medications   albuterol (VENTOLIN HFA) 108 (90  Base) MCG/ACT inhaler   Other Relevant Orders   Ambulatory referral to Cardiology   Pulmonary function test     Pre-operative clearance         Encounter for immunization       Relevant Orders   Flu vaccine trivalent PF, 6mos and older(Flulaval,Afluria,Fluarix,Fluzone) (Completed)   Pneumococcal conjugate vaccine 20-valent (Completed)       Assessment and Plan Assessment & Plan Lumbar spondylosis with spinal canal stenosis Scheduled for surgery on December 3rd. Previous steroid treatment provided relief. - Proceed with scheduled back surgery on December 3rd. Pt medically optimized, recommend Cardiac clearance due to SOB which could likely due to undiagnosed COPD. Referral placed.  Suspected chronic obstructive pulmonary disease (COPD) with shortness of breath Shortness of breath likely related to smoking history.  Suspected COPD due to long-term smoking. Breathing difficulties may impact surgical readiness. - Referred to cardiology for pre-surgical clearance. - Ordered lung function study to evaluate for COPD. - Prescribed inhaler for use every six hours as needed for shortness of breath. - Educated on inhaler use.  Nicotine  dependence, cigarettes Continues to smoke half a pack per day. Acknowledges smoking as a potential cause of shortness of breath. Not interested in quitting but agrees to try nicotine  gum. - Prescribed nicotine  gum for smoking cessation. - Encouraged reduction in smoking. - Discussed smoking cessation options including patches and gum.  Chronic idiopathic constipation Reports difficulty with bowel movements, possibly related to medication use. - Prescribed Senna for constipation management. - Instructed to take Senna daily once, increase to twice daily if no bowel movements, and adjust based on bowel regularity.  General Health Maintenance Due for pneumonia and flu vaccinations. Discussed shingles vaccine due to increased risk with upcoming surgery. - Administered flu shot today. - Administered pneumonia vaccine. - Discussed shingles vaccine for future administration. - UTD on tdap.    Return in about 3 weeks (around 09/08/2024) for chronic follow up with PCP.    Vinary K Dora Simeone, MD [facility] Sports Medicine at [facility]

## 2024-08-18 NOTE — Transitions of Care (Post Inpatient/ED Visit) (Signed)
   08/18/2024  Name: Johnny Gonzales MRN: [id number] DOB: 09/09/1961  Today's TOC FU Call Status: Today's TOC FU Call Status:: Successful TOC FU Call Completed TOC FU Call Complete Date: 08/18/24 Patient's Name and Date of Birth confirmed.  Transition Care Management Follow-up Telephone Call Date of Discharge: 08/12/24 Discharge Facility: [facility] [address] Type of Discharge: Emergency Department Reason for ED Visit: Other: How have you been since you were released from the hospital?: Same Any questions or concerns?: No  Items Reviewed: Did you receive and understand the discharge instructions provided?: No Medications obtained,verified, and reconciled?: Yes (Medications Reviewed) Any new allergies since your discharge?: No Dietary orders reviewed?: NA Do you have support at home?: No  Medications Reviewed Today: Medications Reviewed Today   Medications were not reviewed in this encounter     Home Care and Equipment/Supplies: Were Home Health Services Ordered?: NA Any new equipment or medical supplies ordered?: NA  Functional Questionnaire: Do you need assistance with bathing/showering or dressing?: No Do you need assistance with meal preparation?: No Do you need assistance with eating?: No Do you have difficulty maintaining continence: No Do you need assistance with getting out of bed/getting out of a chair/moving?: No Do you have difficulty managing or taking your medications?: No  Follow up appointments reviewed: PCP Follow-up appointment confirmed?: Yes Date of PCP follow-up appointment?: 08/18/24 Follow-up Provider: [facility] Follow-up appointment confirmed?: No Do you need transportation to your follow-up appointment?: No Do you understand care options if your condition(s) worsen?: Yes-patient verbalized understanding    Syanna Remmert Kristie Harsh, CMA  [facility] Primary Care & Sports Medicine at [facility] [address] [place], [place] [address] Office: [phone] Fax: [phone]

## 2024-08-21 ENCOUNTER — Ambulatory Visit: Admitting: Orthopedic Surgery

## 2024-08-22 ENCOUNTER — Ambulatory Visit: Attending: Cardiology | Admitting: Cardiology

## 2024-08-23 ENCOUNTER — Other Ambulatory Visit: Payer: Self-pay | Admitting: Family Medicine

## 2024-08-23 DIAGNOSIS — E782 Mixed hyperlipidemia: Secondary | ICD-10-CM

## 2024-08-23 DIAGNOSIS — K5904 Chronic idiopathic constipation: Secondary | ICD-10-CM

## 2024-08-23 DIAGNOSIS — M47816 Spondylosis without myelopathy or radiculopathy, lumbar region: Secondary | ICD-10-CM

## 2024-08-23 DIAGNOSIS — R0602 Shortness of breath: Secondary | ICD-10-CM

## 2024-08-23 NOTE — Telephone Encounter (Unsigned)
 Copied from CRM #[id number]. Topic: Clinical - Medication Refill >> Aug 23, 2024  3:41 PM Antwanette L wrote: Medication: albuterol (VENTOLIN HFA) 108 (90 Base) MCG/ACT inhaler Aspirin -Salicylamide-Caffeine (BC HEADACHE POWDER PO) DULoxetine 40 MG CPEP meloxicam  (MOBIC ) 15 MG tablet methocarbamol (ROBAXIN) 500 MG tablet  predniSONE (DELTASONE) 50 MG tablet  rosuvastatin (CRESTOR) 5 MG tablet sennosides-docusate sodium  (SENOKOT-S) 8.6-50 MG tablet  Has the patient contacted their pharmacy? No  This is the patient's preferred pharmacy:  [facility] [phone] [place],  - [address] [place] [address] Phone: [phone] Fax: [phone]  Is this the correct pharmacy for this prescription? Yes   Has the prescription been filled recently? Yes  Is the patient out of the medication? Yes  Has the patient been seen for an appointment in the last year OR does the patient have an upcoming appointment? Yes. Last ov with Dr. Sol was on 08/18/24 and next appt is 09/12/24  Can we respond through MyChart? No. Contact the patient by phone at [phone]  Agent: Please be advised that Rx refills may take up to 3 business days. We ask that you follow-up with your pharmacy.

## 2024-08-24 ENCOUNTER — Ambulatory Visit: Admitting: Family Medicine

## 2024-08-24 ENCOUNTER — Encounter: Payer: Self-pay | Admitting: Cardiology

## 2024-08-25 NOTE — Telephone Encounter (Signed)
 Too soon for refill.  Requested Prescriptions  Pending Prescriptions Disp Refills   albuterol (VENTOLIN HFA) 108 (90 Base) MCG/ACT inhaler 8 g 2    Sig: Inhale 2 puffs into the lungs every 6 (six) hours as needed for wheezing or shortness of breath.     Pulmonology:  Beta Agonists 2 Passed - 08/25/2024  1:26 PM      Passed - Last BP in normal range    BP Readings from Last 1 Encounters:  08/18/24 114/68         Passed - Last Heart Rate in normal range    Pulse Readings from Last 1 Encounters:  08/18/24 87         Passed - Valid encounter within last 12 months    Recent Outpatient Visits           1 week ago Chronic idiopathic constipation   [place] [facility] Kotturi, Vinay K, MD   4 weeks ago Lumbar spondylosis   [place] [facility] at [facility] Kotturi, Vinay K, MD   1 month ago Lumbar spondylosis   [facility] at [facility], Vinay K, MD       Future Appointments             In 1 week Gollan, Timothy J, MD [place] [facility]             DULoxetine HCl 40 MG CPEP 30 capsule 0    Sig: Take 1 capsule (40 mg total) by mouth daily.     Psychiatry: Antidepressants - SNRI - duloxetine Failed - 08/25/2024  1:26 PM      Failed - Cr in normal range and within 360 days    Creatinine  Date Value Ref Range Status  01/21/2012 1.07 0.60 - 1.30 mg/dL Final   Creatinine, Ser  Date Value Ref Range Status  12/28/2020 0.89 0.61 - 1.24 mg/dL Final         Failed - eGFR is 30 or above and within 360 days    EGFR (African American)  Date Value Ref Range Status  01/21/2012 >60  Final   GFR calc Af Amer  Date Value Ref Range Status  05/01/2015 >60 >60 mL/min Final    Comment:    (NOTE) The eGFR has been calculated using the CKD EPI equation. This calculation has not been validated in all clinical situations. eGFR's persistently <60  mL/min signify possible Chronic Kidney Disease.    EGFR (Non-African Amer.)  Date Value Ref Range Status  01/21/2012 >60  Final    Comment:    eGFR values <32mL/min/1.73 m2 may be an indication of chronic kidney disease (CKD). Calculated eGFR is useful in patients with stable renal function. The eGFR calculation will not be reliable in acutely ill patients when serum creatinine is changing rapidly. It is not useful in  patients on dialysis. The eGFR calculation may not be applicable to patients at the low and high extremes of body sizes, pregnant women, and vegetarians.    GFR, Estimated  Date Value Ref Range Status  12/28/2020 >60 >60 mL/min Final    Comment:    (NOTE) Calculated using the CKD-EPI Creatinine Equation (2021)          Passed - Completed PHQ-2 or PHQ-9 in the last 360 days      Passed - Last BP in normal range  BP Readings from Last 1 Encounters:  08/18/24 114/68         Passed - Valid encounter within last 6 months    Recent Outpatient Visits           1 week ago Chronic idiopathic constipation   [place] [facility], Vinay K, MD   4 weeks ago Lumbar spondylosis   [place] [facility] at [facility] Kotturi, Vinay K, MD   1 month ago Lumbar spondylosis   [facility] at [facility], Vinay K, MD       Future Appointments             In 1 week Gollan, Timothy J, MD [place] [facility]             meloxicam  (MOBIC ) 15 MG tablet 30 tablet 1    Sig: Take 1 tablet (15 mg total) by mouth daily.     Analgesics:  COX2 Inhibitors Failed - 08/25/2024  1:26 PM      Failed - Manual Review: Labs are only required if the patient has taken medication for more than 8 weeks.      Failed - HGB in normal range and within 360 days    Hemoglobin  Date Value Ref Range Status  12/28/2020 14.2 13.0 - 17.0 g/dL Final   HGB   Date Value Ref Range Status  01/21/2012 13.2 13.0 - 18.0 g/dL Final         Failed - Cr in normal range and within 360 days    Creatinine  Date Value Ref Range Status  01/21/2012 1.07 0.60 - 1.30 mg/dL Final   Creatinine, Ser  Date Value Ref Range Status  12/28/2020 0.89 0.61 - 1.24 mg/dL Final         Failed - HCT in normal range and within 360 days    HCT  Date Value Ref Range Status  12/28/2020 43.2 39.0 - 52.0 % Final  01/21/2012 39.5 (L) 40.0 - 52.0 % Final         Failed - AST in normal range and within 360 days    AST  Date Value Ref Range Status  04/29/2015 13 (L) 15 - 41 U/L Final   SGOT(AST)  Date Value Ref Range Status  01/21/2012 46 (H) 15 - 37 Unit/L Final         Failed - ALT in normal range and within 360 days    ALT  Date Value Ref Range Status  04/29/2015 13 (L) 17 - 63 U/L Final   SGPT (ALT)  Date Value Ref Range Status  01/21/2012 34 U/L Final    Comment:    12-78 NOTE: NEW REFERENCE RANGE 09/04/2011          Failed - eGFR is 30 or above and within 360 days    EGFR (African American)  Date Value Ref Range Status  01/21/2012 >60  Final   GFR calc Af Amer  Date Value Ref Range Status  05/01/2015 >60 >60 mL/min Final    Comment:    (NOTE) The eGFR has been calculated using the CKD EPI equation. This calculation has not been validated in all clinical situations. eGFR's persistently <60 mL/min signify possible Chronic Kidney Disease.    EGFR (Non-African Amer.)  Date Value Ref Range Status  01/21/2012 >60  Final    Comment:    eGFR values <  59mL/min/1.73 m2 may be an indication of chronic kidney disease (CKD). Calculated eGFR is useful in patients with stable renal function. The eGFR calculation will not be reliable in acutely ill patients when serum creatinine is changing rapidly. It is not useful in  patients on dialysis. The eGFR calculation may not be applicable to patients at the low and high extremes of body sizes,  pregnant women, and vegetarians.    GFR, Estimated  Date Value Ref Range Status  12/28/2020 >60 >60 mL/min Final    Comment:    (NOTE) Calculated using the CKD-EPI Creatinine Equation (2021)          Passed - Patient is not pregnant      Passed - Valid encounter within last 12 months    Recent Outpatient Visits           1 week ago Chronic idiopathic constipation   [place] [facility] Kotturi, Vinay K, MD   4 weeks ago Lumbar spondylosis   [place] [facility] at [facility] Kotturi, Vinay K, MD   1 month ago Lumbar spondylosis   [facility] at [facility], Vinay K, MD       Future Appointments             In 1 week Gollan, Timothy J, MD [place] [facility]             methocarbamol (ROBAXIN) 500 MG tablet 90 tablet 1    Sig: Take 1 tablet (500 mg total) by mouth 3 (three) times daily.     Not Delegated - Analgesics:  Muscle Relaxants Failed - 08/25/2024  1:26 PM      Failed - This refill cannot be delegated      Passed - Valid encounter within last 6 months    Recent Outpatient Visits           1 week ago Chronic idiopathic constipation   [place] [facility] at [facility], Vinay K, MD   4 weeks ago Lumbar spondylosis   [place] [facility] at [facility], Vinay K, MD   1 month ago Lumbar spondylosis   [place] [facility] at [facility], Vinay K, MD       Future Appointments             In 1 week Gollan, Timothy J, MD  [facility] at [facility]             predniSONE (DELTASONE) 50 MG tablet 3 tablet 0    Sig: Take 1 tablet (50 mg total) by mouth daily with breakfast.     Not Delegated - Endocrinology:  Oral Corticosteroids Failed - 08/25/2024  1:26 PM      Failed - This refill cannot be delegated       Failed - Manual Review: Eye exam for IOP if prolonged treatment      Failed - Glucose (serum) in normal range and within 180 days    Glucose  Date Value Ref Range Status  01/21/2012 133 (H) 65 - 99 mg/dL Final   Glucose, Bld  Date Value Ref Range Status  12/28/2020 108 (H) 70 - 99 mg/dL Final    Comment:    Glucose reference range applies only to samples taken after fasting for at least 8 hours.   Glucose-Capillary  Date Value Ref Range Status  05/01/2015 102 (H) 65 - 99 mg/dL Final         Failed - K in normal range and within 180 days    Potassium  Date Value Ref Range Status  12/28/2020 3.8 3.5 - 5.1 mmol/L Final  01/21/2012 3.8 3.5 - 5.1 mmol/L Final         Failed - Na in normal range and within 180 days    Sodium  Date Value Ref Range Status  12/28/2020 136 135 - 145 mmol/L Final  01/21/2012 140 136 - 145 mmol/L Final         Failed - Bone Mineral Density or Dexa Scan completed in the last 2 years      Passed - Last BP in normal range    BP Readings from Last 1 Encounters:  08/18/24 114/68         Passed - Valid encounter within last 6 months    Recent Outpatient Visits           1 week ago Chronic idiopathic constipation   [place] [facility] Kotturi, Vinay K, MD   4 weeks ago Lumbar spondylosis   [place] [facility] Kotturi, Vinay K, MD   1 month ago Lumbar spondylosis   [facility] at [facility], Vinay K, MD       Future Appointments             In 1 week Gollan, Timothy J, MD [place] [facility] at [facility]             rosuvastatin (CRESTOR) 5 MG tablet 90 tablet 3    Sig: Take 1 tablet (5 mg total) by mouth daily.     Cardiovascular:  Antilipid - Statins 2 Failed - 08/25/2024  1:26 PM      Failed - Cr in normal range and within 360 days    Creatinine  Date Value Ref Range Status  01/21/2012 1.07 0.60  - 1.30 mg/dL Final   Creatinine, Ser  Date Value Ref Range Status  12/28/2020 0.89 0.61 - 1.24 mg/dL Final         Failed - Lipid Panel in normal range within the last 12 months    Cholesterol, Total  Date Value Ref Range Status  07/20/2024 211 (H) 100 - 199 mg/dL Final   LDL Chol Calc (NIH)  Date Value Ref Range Status  07/20/2024 154 (H) 0 - 99 mg/dL Final   HDL  Date Value Ref Range Status  07/20/2024 34 (L) >39 mg/dL Final   Triglycerides  Date Value Ref Range Status  07/20/2024 126 0 - 149 mg/dL Final         Passed - Patient is not pregnant      Passed - Valid encounter within last 12 months    Recent Outpatient Visits           1 week ago Chronic idiopathic constipation   [place] [facility] at [facility] Kotturi, Vinay K, MD   4 weeks ago Lumbar spondylosis   [facility] at [facility] Kotturi, Vinay K, MD   1 month ago Lumbar spondylosis   [facility] at [facility], Vinay K, MD       Future Appointments  In 1 week Gollan, Timothy J, MD [facility]             sennosides-docusate sodium  (SENOKOT-S) 8.6-50 MG tablet 30 tablet 1    Sig: Take 1 tablet by mouth daily.     Over the Counter:  OTC Passed - 08/25/2024  1:26 PM      Passed - Valid encounter within last 12 months    Recent Outpatient Visits           1 week ago Chronic idiopathic constipation   [facility] at [facility], Vinay K, MD   4 weeks ago Lumbar spondylosis   [facility] at [facility], Vinay K, MD   1 month ago Lumbar spondylosis   [facility] at [facility], Vinay K, MD       Future Appointments             In 1 week Gollan, Timothy J, MD [facility] at [facility]

## 2024-08-30 NOTE — Patient Instructions (Addendum)
 Your procedure is scheduled on:09-13-24 Wednesday Report to the Registration Desk on the 1st floor of the Medical Mall.Then proceed to the 2nd floor Surgery Desk To find out your arrival time, please call [phone] between 1PM - 3PM on:09-12-24 Tuesday If your arrival time is 6:00 am, do not arrive before that time as the Medical Mall entrance doors do not open until 6:00 am.  REMEMBER: Instructions that are not followed completely may result in serious medical risk, up to and including death; or upon the discretion of your surgeon and anesthesiologist your surgery may need to be rescheduled.  Do not eat food after midnight the night before surgery.  No gum chewing or hard candies.  You may however, drink Water up to 2 hours before you are scheduled to arrive for your surgery. Do not drink anything within 2 hours of your scheduled arrival time.  One week prior to surgery: Stop ANY OVER THE COUNTER supplements until after surgery.  Stop metFORMIN  (GLUCOPHAGE ) 2 days prior to surgery-Last dose will be on 09-10-24 Sunday  Stop Aspirin -Salicylamide-Caffeine (BC HEADACHE POWDER) 7 days prior to surgery-Last dose will be on 09-05-24 Tuesday  Continue taking all of your other prescription medications up until the day of surgery.  ON THE DAY OF SURGERY ONLY TAKE THESE MEDICATIONS WITH SIPS OF WATER: -DULoxetine  (Cymbalta )  Bring your Albuterol  Inhaler to the hospital  No Alcohol for 24 hours before or after surgery.  No Smoking including e-cigarettes for 24 hours before surgery.  No chewable tobacco products for at least 6 hours before surgery.  No nicotine  patches on the day of surgery.  Do not use any recreational drugs for at least a week (preferably 2 weeks) before your surgery.  Please be advised that the combination of cocaine and anesthesia may have negative outcomes, up to and including death. If you test positive for cocaine, your surgery will be cancelled.  On the morning  of surgery brush your teeth with toothpaste and water, you may rinse your mouth with mouthwash if you wish. Do not swallow any toothpaste or mouthwash.  Use CHG Soap as directed on instruction sheet.  Do not wear jewelry, make-up, hairpins, clips or nail polish.  For welded (permanent) jewelry: bracelets, anklets, waist bands, etc.  Please have this removed prior to surgery.  If it is not removed, there is a chance that hospital personnel will need to cut it off on the day of surgery.  Do not wear lotions, powders, or perfumes.   Do not shave body hair from the neck down 48 hours before surgery.  Contact lenses, hearing aids and dentures may not be worn into surgery.  Do not bring valuables to the hospital. [facility] is not responsible for any missing/lost belongings or valuables.   Notify your doctor if there is any change in your medical condition (cold, fever, infection).  Wear comfortable clothing (specific to your surgery type) to the hospital.  After surgery, you can help prevent lung complications by doing breathing exercises.  Take deep breaths and cough every 1-2 hours. Your doctor may order a device called an Incentive Spirometer to help you take deep breaths. When coughing or sneezing, hold a pillow firmly against your incision with both hands. This is called "splinting." Doing this helps protect your incision. It also decreases belly discomfort.  If you are being admitted to the hospital overnight, leave your suitcase in the car. After surgery it may be brought to your room.  In case of  increased patient census, it may be necessary for you, the patient, to continue your postoperative care in the Same Day Surgery department.  If you are being discharged the day of surgery, you will not be allowed to drive home. You will need a responsible individual to drive you home and stay with you for 24 hours after surgery.   If you are taking public transportation, you will need to  have a responsible individual with you.  Please call the Pre-admissions Testing Dept. at [phone] if you have any questions about these instructions.  Surgery Visitation Policy:  Patients having surgery or a procedure may have two visitors.  Children under the age of 28 must have an adult with them who is not the patient.  Inpatient Visitation:    Visiting hours are 7 a.m. to 8 p.m. Up to four visitors are allowed at one time in a patient room. The visitors may rotate out with other people during the day.  One visitor age 57 or older may stay with the patient overnight and must be in the room by 8 p.m.    Pre-operative 4 CHG Bath Instructions   You can play a key role in reducing the risk of infection after surgery. Your skin needs to be as free of germs as possible. You can reduce the number of germs on your skin by washing with CHG (chlorhexidine gluconate) soap before surgery. CHG is an antiseptic soap that kills germs and continues to kill germs even after washing.   DO NOT use if you have an allergy to chlorhexidine/CHG or antibacterial soaps. If your skin becomes reddened or irritated, stop using the CHG and notify one of our RNs at [phone].   Please shower with the CHG soap starting 4 days before surgery using the following schedule:     Please keep in mind the following:  DO NOT shave, including legs and underarms, starting the day of your first shower.   You may shave your face at any point before/day of surgery.  Place clean sheets on your bed the day you start using CHG soap. Use a clean washcloth (not used since being washed) for each shower. DO NOT sleep with pets once you start using the CHG.   CHG Shower Instructions:  If you choose to wash your hair and private area, wash first with your normal shampoo/soap.  After you use shampoo/soap, rinse your hair and body thoroughly to remove shampoo/soap residue.  Turn the water OFF and apply about 3 tablespoons  (45 ml) of CHG soap to a CLEAN washcloth.  Apply CHG soap ONLY FROM YOUR NECK DOWN TO YOUR TOES (washing for 3-5 minutes)  DO NOT use CHG soap on face, private areas, open wounds, or sores.  Pay special attention to the area where your surgery is being performed.  If you are having back surgery, having someone wash your back for you may be helpful. Wait 2 minutes after CHG soap is applied, then you may rinse off the CHG soap.  Pat dry with a clean towel  Put on clean clothes/pajamas   If you choose to wear lotion, please use ONLY the CHG-compatible lotions on the back of this paper.     Additional instructions for the day of surgery: DO NOT APPLY any lotions, deodorants, cologne, or perfumes.   Put on clean/comfortable clothes.  Brush your teeth.  Ask your nurse before applying any prescription medications to the skin.      CHG Compatible Lotions  Aveeno Moisturizing lotion  Cetaphil Moisturizing Cream  Cetaphil Moisturizing Lotion  Clairol Herbal Essence Moisturizing Lotion, Dry Skin  Clairol Herbal Essence Moisturizing Lotion, Extra Dry Skin  Clairol Herbal Essence Moisturizing Lotion, Normal Skin  Curel Age Defying Therapeutic Moisturizing Lotion with Alpha Hydroxy  Curel Extreme Care Body Lotion  Curel Soothing Hands Moisturizing Hand Lotion  Curel Therapeutic Moisturizing Cream, Fragrance-Free  Curel Therapeutic Moisturizing Lotion, Fragrance-Free  Curel Therapeutic Moisturizing Lotion, Original Formula  Eucerin Daily Replenishing Lotion  Eucerin Dry Skin Therapy Plus Alpha Hydroxy Crme  Eucerin Dry Skin Therapy Plus Alpha Hydroxy Lotion  Eucerin Original Crme  Eucerin Original Lotion  Eucerin Plus Crme Eucerin Plus Lotion  Eucerin TriLipid Replenishing Lotion  Keri Anti-Bacterial Hand Lotion  Keri Deep Conditioning Original Lotion Dry Skin Formula Softly Scented  Keri Deep Conditioning Original Lotion, Fragrance Free Sensitive Skin Formula  Keri Lotion Fast  Absorbing Fragrance Free Sensitive Skin Formula  Keri Lotion Fast Absorbing Softly Scented Dry Skin Formula  Keri Original Lotion  Keri Skin Renewal Lotion Keri Silky Smooth Lotion  Keri Silky Smooth Sensitive Skin Lotion  Nivea Body Creamy Conditioning Oil  Nivea Body Extra Enriched Teacher, Adult Education Moisturizing Lotion Nivea Crme  Nivea Skin Firming Lotion  NutraDerm 30 Skin Lotion  NutraDerm Skin Lotion  NutraDerm Therapeutic Skin Cream  NutraDerm Therapeutic Skin Lotion  ProShield Protective Hand Cream  Provon moisturizing lotion     Merchandiser, Retail to address health-related social needs:  [email or web address]

## 2024-08-31 ENCOUNTER — Encounter
Admission: RE | Admit: 2024-08-31 | Discharge: 2024-08-31 | Disposition: A | Discharge status: Home / Self Care | Source: Ambulatory Visit | Attending: Neurosurgery | Admitting: Neurosurgery

## 2024-08-31 ENCOUNTER — Other Ambulatory Visit: Payer: Self-pay

## 2024-08-31 DIAGNOSIS — R7303 Prediabetes: Secondary | ICD-10-CM

## 2024-08-31 DIAGNOSIS — R0602 Shortness of breath: Secondary | ICD-10-CM

## 2024-08-31 DIAGNOSIS — Z01810 Encounter for preprocedural cardiovascular examination: Secondary | ICD-10-CM

## 2024-08-31 DIAGNOSIS — Z01818 Encounter for other preprocedural examination: Secondary | ICD-10-CM

## 2024-08-31 DIAGNOSIS — M48061 Spinal stenosis, lumbar region without neurogenic claudication: Secondary | ICD-10-CM

## 2024-08-31 DIAGNOSIS — F1491 Cocaine use, unspecified, in remission: Secondary | ICD-10-CM

## 2024-08-31 DIAGNOSIS — Z01812 Encounter for preprocedural laboratory examination: Secondary | ICD-10-CM

## 2024-08-31 HISTORY — DX: Nicotine dependence, cigarettes, uncomplicated: F17.210

## 2024-08-31 HISTORY — DX: Dyspnea, unspecified: R06.00

## 2024-08-31 HISTORY — DX: Hyperlipidemia, unspecified: E78.5

## 2024-08-31 HISTORY — DX: Spondylosis without myelopathy or radiculopathy, lumbar region: M47.816

## 2024-08-31 HISTORY — DX: Spinal stenosis, lumbar region without neurogenic claudication: M48.061

## 2024-08-31 HISTORY — DX: Cocaine use, unspecified, in remission: F14.91

## 2024-08-31 HISTORY — DX: Prediabetes: R73.03

## 2024-09-04 ENCOUNTER — Encounter
Admission: RE | Admit: 2024-09-04 | Discharge: 2024-09-04 | Disposition: A | Discharge status: Home / Self Care | Source: Ambulatory Visit | Attending: Neurosurgery | Admitting: Neurosurgery

## 2024-09-04 DIAGNOSIS — M4714 Other spondylosis with myelopathy, thoracic region: Secondary | ICD-10-CM | POA: Diagnosis not present

## 2024-09-04 DIAGNOSIS — M549 Dorsalgia, unspecified: Secondary | ICD-10-CM | POA: Insufficient documentation

## 2024-09-04 DIAGNOSIS — M4804 Spinal stenosis, thoracic region: Secondary | ICD-10-CM | POA: Diagnosis not present

## 2024-09-04 DIAGNOSIS — F1491 Cocaine use, unspecified, in remission: Secondary | ICD-10-CM | POA: Insufficient documentation

## 2024-09-04 DIAGNOSIS — Z01812 Encounter for preprocedural laboratory examination: Secondary | ICD-10-CM | POA: Diagnosis present

## 2024-09-04 DIAGNOSIS — Z01818 Encounter for other preprocedural examination: Secondary | ICD-10-CM | POA: Insufficient documentation

## 2024-09-04 DIAGNOSIS — E785 Hyperlipidemia, unspecified: Secondary | ICD-10-CM | POA: Insufficient documentation

## 2024-09-04 DIAGNOSIS — R0602 Shortness of breath: Secondary | ICD-10-CM | POA: Insufficient documentation

## 2024-09-04 DIAGNOSIS — G9589 Other specified diseases of spinal cord: Secondary | ICD-10-CM | POA: Diagnosis not present

## 2024-09-04 DIAGNOSIS — Z01810 Encounter for preprocedural cardiovascular examination: Secondary | ICD-10-CM | POA: Diagnosis present

## 2024-09-04 DIAGNOSIS — R7303 Prediabetes: Secondary | ICD-10-CM | POA: Diagnosis not present

## 2024-09-04 DIAGNOSIS — F17200 Nicotine dependence, unspecified, uncomplicated: Secondary | ICD-10-CM | POA: Insufficient documentation

## 2024-09-04 LAB — URINALYSIS, COMPLETE (UACMP) WITH MICROSCOPIC
Bilirubin Urine: NEGATIVE
Glucose, UA: >=500 mg/dL — AB
Hgb urine dipstick: NEGATIVE
Ketones, ur: NEGATIVE mg/dL
Leukocytes,Ua: NEGATIVE
Nitrite: NEGATIVE
Protein, ur: NEGATIVE mg/dL
Specific Gravity, Urine: 1.015 (ref 1.005–1.030)
Squamous Epithelial / HPF: 0 /HPF (ref 0–5)
pH: 6 (ref 5.0–8.0)

## 2024-09-04 LAB — TYPE AND SCREEN
ABO/RH(D): B POS
Antibody Screen: NEGATIVE

## 2024-09-04 LAB — SURGICAL PCR SCREEN
MRSA, PCR: NEGATIVE
Staphylococcus aureus: NEGATIVE

## 2024-09-04 NOTE — Progress Notes (Deleted)
 Cardiology Office Note  Date:  09/04/2024   ID:  Johnny Gonzales, DOB May 18, 1961, MRN [id number]  PCP:  Kotturi, Vinay K, MD   No chief complaint on file.   HPI:  Johnny Gonzales is a 63 y.o. male with past medical history of: Past Medical History:  Diagnosis Date   Back pain    Cigarette smoker    Dyspnea    History of cocaine use    Hyperlipidemia    Lumbar spinal stenosis    Lumbar spondylosis    Pre-diabetes   Obesity Who presents by referral from Dr. Sol for shortness of breath  Preop for back surgery January 2026 On visit with primary care in August 18, 2024 shortness of breath when taking deep breaths which he attributes to his back pain Smokes 1/2 pack of cigarettes per week Winded easily  Concern for COPD   PMH:   has a past medical history of Back pain, Cigarette smoker, Dyspnea, History of cocaine use, Hyperlipidemia, Lumbar spinal stenosis, Lumbar spondylosis, and Pre-diabetes.   PSH:    Past Surgical History:  Procedure Laterality Date   HAND SURGERY     INCISION AND DRAINAGE ABSCESS N/A 04/30/2015   Procedure: INCISION AND DRAINAGE ABSCESS/PERINEAL ABSCESS;  Surgeon: Rosina Riis, MD;  Location: [facility] ORS;  Service: Urology;  Laterality: N/A;    Current Outpatient Medications  Medication Sig Dispense Refill   albuterol  (VENTOLIN  HFA) 108 (90 Base) MCG/ACT inhaler Inhale 2 puffs into the lungs every 6 (six) hours as needed for wheezing or shortness of breath. 8 g 2   Aspirin -Salicylamide-Caffeine (BC HEADACHE POWDER PO) Take 1 packet by mouth daily as needed (pain and stiffness.).     DULoxetine  40 MG CPEP Take 1 capsule (40 mg total) by mouth daily. (Patient taking differently: Take 40 mg by mouth every morning.) 30 capsule 0   metFORMIN  (GLUCOPHAGE ) 500 MG tablet Take 1 tablet (500 mg total) by mouth 2 (two) times daily with a meal. (Patient taking differently: Take 500 mg by mouth daily.) 180 tablet 3   methocarbamol  (ROBAXIN ) 500 MG tablet Take 1 tablet  (500 mg total) by mouth 3 (three) times daily. (Patient taking differently: Take 500 mg by mouth every 8 (eight) hours as needed for muscle spasms.) 90 tablet 1   nicotine  polacrilex (NICORETTE ) 4 MG gum Take 1 each (4 mg total) by mouth as needed for smoking cessation. 100 tablet 0   rosuvastatin  (CRESTOR ) 5 MG tablet Take 1 tablet (5 mg total) by mouth daily. 90 tablet 3   sennosides-docusate sodium  (SENOKOT-S) 8.6-50 MG tablet Take 1 tablet by mouth daily. 30 tablet 1   No current facility-administered medications for this visit.     Allergies:   Patient has no known allergies.   Social History:  The patient  reports that he has been smoking cigarettes. He has never used smokeless tobacco. He reports that he does not currently use alcohol after a past usage of about 2.0 standard drinks of alcohol per week. He reports that he does not currently use drugs after having used the following drugs: Cocaine.   Family History:   family history includes Heart attack in his brother and mother; Stroke in his father.    Review of Systems: ROS   PHYSICAL EXAM: VS:  There were no vitals taken for this visit. , BMI There is no height or weight on file to calculate BMI. GEN: Well nourished, well developed, in no acute distress HEENT: normal Neck: no JVD, carotid  bruits, or masses Cardiac: RRR; no murmurs, rubs, or gallops,no edema  Respiratory:  clear to auscultation bilaterally, normal work of breathing GI: soft, nontender, nondistended, + BS MS: no deformity or atrophy Skin: warm and dry, no rash Neuro:  Strength and sensation are intact Psych: euthymic mood, full affect    Recent Labs: No results found for requested labs within last 365 days.    Lipid Panel Lab Results  Component Value Date   CHOL 211 (H) 07/20/2024   HDL 34 (L) 07/20/2024   LDLCALC 154 (H) 07/20/2024   TRIG 126 07/20/2024      Wt Readings from Last 3 Encounters:  08/18/24 240 lb (108.9 kg)  08/17/24 239 lb  (108.4 kg)  08/12/24 239 lb (108.4 kg)       ASSESSMENT AND PLAN:  Problem List Items Addressed This Visit   None    Disposition:   F/U  12 months   Total encounter time more than 30 minutes  Greater than 50% was spent in counseling and coordination of care with the patient    Signed, Velinda Lunger, M.D., Ph.D. [facility] [place], [place] [phone]

## 2024-09-05 ENCOUNTER — Ambulatory Visit: Admitting: Cardiovascular Disease

## 2024-09-05 DIAGNOSIS — E782 Mixed hyperlipidemia: Secondary | ICD-10-CM

## 2024-09-05 DIAGNOSIS — F17200 Nicotine dependence, unspecified, uncomplicated: Secondary | ICD-10-CM

## 2024-09-05 DIAGNOSIS — R0602 Shortness of breath: Secondary | ICD-10-CM

## 2024-09-12 ENCOUNTER — Ambulatory Visit (INDEPENDENT_AMBULATORY_CARE_PROVIDER_SITE_OTHER): Admitting: Family Medicine

## 2024-09-12 ENCOUNTER — Encounter: Payer: Self-pay | Admitting: Family Medicine

## 2024-09-12 VITALS — BP 118/72 | HR 75 | Ht 72.0 in | Wt 240.0 lb

## 2024-09-12 DIAGNOSIS — R0602 Shortness of breath: Secondary | ICD-10-CM | POA: Diagnosis not present

## 2024-09-12 MED ORDER — CHLORHEXIDINE GLUCONATE 0.12 % MT SOLN
15.0000 mL | Freq: Once | OROMUCOSAL | Status: AC
  Administered 2024-09-13: 15 mL via OROMUCOSAL

## 2024-09-12 MED ORDER — ORAL CARE MOUTH RINSE: 15.0000 mL | Freq: Once | OROMUCOSAL | Status: AC

## 2024-09-12 MED ORDER — BREZTRI AEROSPHERE 160-9-4.8 MCG/ACT IN AERO: 2.0000 | INHALATION_SPRAY | Freq: Two times a day (BID) | RESPIRATORY_TRACT | 11 refills | Status: DC

## 2024-09-12 MED ORDER — VANCOMYCIN HCL IN DEXTROSE 1-5 GM/200ML-% IV SOLN
1000.0000 mg | Freq: Once | INTRAVENOUS | Status: AC
  Administered 2024-09-13: 1000 mg via INTRAVENOUS

## 2024-09-12 MED ORDER — LACTATED RINGERS IV SOLN: INTRAVENOUS | Status: DC

## 2024-09-12 NOTE — Progress Notes (Signed)
 Established Patient Office Visit  Patient ID: Devonte Migues, male    DOB: [birth year]  Age: 63 y.o. MRN: [id number] PCP: Sakeena Teall K, MD  Chief Complaint  Patient presents with   Follow-up    LUMBAR SPONDYLOSIS    Subjective:     HPI  Discussed the use of AI scribe software for clinical note transcription with the patient, who gave verbal consent to proceed.  History of Present Illness Juventino Pavone is a 63 year old male who presents with knee pain and smoking cessation concerns.  He experiences significant knee pain but does not provide further details on the location, radiation, or specific triggers. There is no mention of any prior treatments or diagnostic studies related to his knee pain.  He is concerned about his smoking habits, stating that he smoked two cigarettes this morning and typically smokes about six to seven cigarettes per day. He wants to quit smoking, especially after his upcoming surgery. He has not yet utilized nicotine  replacement therapies such as gum, although he has them available.  Regarding his respiratory health, his breathing is fine and he is not currently using his prescribed inhaler. He has not purchased the inhaler yet. He denies receiving any appointment reminders from pulmonology or cardiology, although he has an upcoming cardiology appointment on December 8th for surgical clearance.  He has not started taking metformin , despite it being prescribed. There is no further discussion on his diabetes management or related symptoms.  He reports his breathing is fine and he is not using his inhaler.      Review of Systems  All other systems reviewed and are negative.     Objective:     BP 118/72   Pulse 75   Ht 6' (1.829 m)   Wt 240 lb (108.9 kg)   SpO2 98%   BMI 32.55 kg/m    Physical Exam Vitals and nursing note reviewed.  Constitutional:      Appearance: Normal appearance.  HENT:     Head: Normocephalic.     Right Ear: External  ear normal.     Left Ear: External ear normal.  Eyes:     Conjunctiva/sclera: Conjunctivae normal.  Cardiovascular:     Rate and Rhythm: Normal rate.  Pulmonary:     Effort: Pulmonary effort is normal. No respiratory distress.  Abdominal:     Palpations: Abdomen is soft.  Musculoskeletal:        General: Normal range of motion.  Skin:    General: Skin is warm.  Neurological:     Mental Status: He is alert and oriented to person, place, and time.  Psychiatric:        Mood and Affect: Mood normal.     Physical Exam VITALS: BP- 118/72   No results found for any visits on 09/12/24.     The 10-year ASCVD risk score (Arnett DK, et al., 2019) is: 14.7%    Assessment & Plan:   Problem List Items Addressed This Visit       Other   Shortness of breath - Primary   Relevant Medications   budesonide-glycopyrrolate-formoterol (BREZTRI AEROSPHERE) 160-9-4.8 MCG/ACT AERO inhaler    Assessment and Plan Assessment & Plan Suspected chronic obstructive pulmonary disease (COPD) with shortness of breath Breathing well-managed, no recent exacerbations, did not pick up the albuterol  inhaler. - Breztri sample provided, inhaler technique education given. Pt need PFT, was referred to [facility]. No appointment yet. - Start Breztri inhaler twice daily, two puffs.  Nicotine   dependence, cigarettes Currently smoking 6-7 cigarettes per day. Plans to quit after surgery. Discussed quitting challenges and benefits of nicotine  replacement therapy. Agreed to try nicotine  gum. - Use nicotine  gum as needed. - Consider nicotine  replacement pills if gum is ineffective.  Lumbar spondylosis with spinal canal stenosis Reports knee pain possibly related to lumbar spondylosis. Scheduled for surgery tomorrow. - Proceed with scheduled surgery.    No follow-ups on file.    Vinary K Athalia Setterlund, MD [facility] at [facility]

## 2024-09-13 ENCOUNTER — Inpatient Hospital Stay

## 2024-09-13 ENCOUNTER — Inpatient Hospital Stay
Admission: RE | Admit: 2024-09-13 | Discharge: 2024-09-19 | DRG: 451 | Disposition: A | Source: Ambulatory Visit | Attending: Neurosurgery | Admitting: Neurosurgery

## 2024-09-13 ENCOUNTER — Inpatient Hospital Stay: Payer: Self-pay | Admitting: Urgent Care

## 2024-09-13 ENCOUNTER — Other Ambulatory Visit: Payer: Self-pay

## 2024-09-13 ENCOUNTER — Encounter: Admission: RE | Disposition: A | Payer: Self-pay | Source: Ambulatory Visit | Attending: Neurosurgery

## 2024-09-13 ENCOUNTER — Inpatient Hospital Stay: Payer: Self-pay

## 2024-09-13 ENCOUNTER — Encounter: Payer: Self-pay | Admitting: Neurosurgery

## 2024-09-13 DIAGNOSIS — M4804 Spinal stenosis, thoracic region: Secondary | ICD-10-CM | POA: Diagnosis not present

## 2024-09-13 DIAGNOSIS — Z01818 Encounter for other preprocedural examination: Secondary | ICD-10-CM

## 2024-09-13 DIAGNOSIS — F17200 Nicotine dependence, unspecified, uncomplicated: Secondary | ICD-10-CM | POA: Diagnosis present

## 2024-09-13 DIAGNOSIS — Z1211 Encounter for screening for malignant neoplasm of colon: Secondary | ICD-10-CM

## 2024-09-13 DIAGNOSIS — G9589 Other specified diseases of spinal cord: Secondary | ICD-10-CM | POA: Diagnosis present

## 2024-09-13 DIAGNOSIS — E119 Type 2 diabetes mellitus without complications: Secondary | ICD-10-CM

## 2024-09-13 DIAGNOSIS — M4714 Other spondylosis with myelopathy, thoracic region: Principal | ICD-10-CM | POA: Diagnosis present

## 2024-09-13 DIAGNOSIS — E785 Hyperlipidemia, unspecified: Secondary | ICD-10-CM | POA: Diagnosis present

## 2024-09-13 HISTORY — PX: THORACIC DISCECTOMY: SHX6113

## 2024-09-13 HISTORY — PX: APPLICATION OF INTRAOPERATIVE CT SCAN: SHX6668

## 2024-09-13 LAB — ABO/RH: ABO/RH(D): B POS

## 2024-09-13 LAB — GLUCOSE, CAPILLARY
Glucose-Capillary: 165 mg/dL — ABNORMAL HIGH (ref 70–99)
Glucose-Capillary: 197 mg/dL — ABNORMAL HIGH (ref 70–99)

## 2024-09-13 SURGERY — THORACIC DISCECTOMY
Anesthesia: General | Site: Spine Thoracic

## 2024-09-13 MED ORDER — FENTANYL CITRATE (PF) 250 MCG/5ML IJ SOLN
INTRAMUSCULAR | Status: AC
  Filled 2024-09-13: qty 5

## 2024-09-13 MED ORDER — METHOCARBAMOL 1000 MG/10ML IJ SOLN: 500.0000 mg | Freq: Four times a day (QID) | INTRAMUSCULAR | Status: DC | PRN

## 2024-09-13 MED ORDER — SUCCINYLCHOLINE CHLORIDE 200 MG/10ML IV SOSY
PREFILLED_SYRINGE | INTRAVENOUS | Status: DC | PRN
  Administered 2024-09-13: 160 mg via INTRAVENOUS

## 2024-09-13 MED ORDER — DEXAMETHASONE SOD PHOSPHATE PF 10 MG/ML IJ SOLN
INTRAMUSCULAR | Status: DC | PRN
  Administered 2024-09-13 (×2): 10 mg via INTRAVENOUS

## 2024-09-13 MED ORDER — ACETAMINOPHEN 10 MG/ML IV SOLN
INTRAVENOUS | Status: DC | PRN
  Administered 2024-09-13: 1000 mg via INTRAVENOUS

## 2024-09-13 MED ORDER — PHENYLEPHRINE HCL-NACL 20-0.9 MG/250ML-% IV SOLN
INTRAVENOUS | Status: AC
  Filled 2024-09-13: qty 250

## 2024-09-13 MED ORDER — FENTANYL CITRATE (PF) 250 MCG/5ML IJ SOLN
INTRAMUSCULAR | Status: DC | PRN
  Administered 2024-09-13: 100 ug via INTRAVENOUS
  Administered 2024-09-13: 50 ug via INTRAVENOUS
  Administered 2024-09-13: 100 ug via INTRAVENOUS

## 2024-09-13 MED ORDER — ENOXAPARIN SODIUM 40 MG/0.4ML IJ SOSY
40.0000 mg | PREFILLED_SYRINGE | INTRAMUSCULAR | Status: DC
  Administered 2024-09-14 – 2024-09-19 (×6): 40 mg via SUBCUTANEOUS
  Filled 2024-09-13 (×6): qty 0.4

## 2024-09-13 MED ORDER — DOCUSATE SODIUM 100 MG PO CAPS
100.0000 mg | ORAL_CAPSULE | Freq: Two times a day (BID) | ORAL | Status: DC
  Administered 2024-09-13 – 2024-09-19 (×11): 100 mg via ORAL
  Filled 2024-09-13 (×12): qty 1

## 2024-09-13 MED ORDER — REMIFENTANIL HCL 1 MG IV SOLR
INTRAVENOUS | Status: AC
  Filled 2024-09-13: qty 1000

## 2024-09-13 MED ORDER — SURGIFLO WITH THROMBIN (HEMOSTATIC MATRIX KIT) OPTIME
TOPICAL | Status: DC | PRN
  Administered 2024-09-13: 1 via TOPICAL

## 2024-09-13 MED ORDER — DROPERIDOL 2.5 MG/ML IJ SOLN: 0.6250 mg | Freq: Once | INTRAMUSCULAR | Status: DC | PRN

## 2024-09-13 MED ORDER — HYDROMORPHONE HCL 1 MG/ML IJ SOLN
INTRAMUSCULAR | Status: AC
  Filled 2024-09-13: qty 1

## 2024-09-13 MED ORDER — SENNA 8.6 MG PO TABS
1.0000 | ORAL_TABLET | Freq: Two times a day (BID) | ORAL | Status: DC
  Administered 2024-09-13 – 2024-09-19 (×10): 8.6 mg via ORAL
  Filled 2024-09-13 (×12): qty 1

## 2024-09-13 MED ORDER — PROPOFOL 10 MG/ML IV BOLUS
INTRAVENOUS | Status: AC
  Filled 2024-09-13: qty 20

## 2024-09-13 MED ORDER — SODIUM CHLORIDE 0.9 % IV SOLN
250.0000 mL | INTRAVENOUS | Status: AC
  Administered 2024-09-13: 250 mL via INTRAVENOUS

## 2024-09-13 MED ORDER — MAGNESIUM CITRATE PO SOLN
1.0000 | Freq: Once | ORAL | Status: AC | PRN
  Administered 2024-09-17: 1 via ORAL
  Filled 2024-09-13: qty 296

## 2024-09-13 MED ORDER — HYDROMORPHONE HCL 1 MG/ML IJ SOLN
INTRAMUSCULAR | Status: DC | PRN
  Administered 2024-09-13: 1 mg via INTRAVENOUS
  Administered 2024-09-13: .5 mg via INTRAVENOUS

## 2024-09-13 MED ORDER — ACETAMINOPHEN 500 MG PO TABS
1000.0000 mg | ORAL_TABLET | Freq: Four times a day (QID) | ORAL | Status: DC
  Administered 2024-09-13 – 2024-09-19 (×20): 1000 mg via ORAL
  Filled 2024-09-13 (×22): qty 2

## 2024-09-13 MED ORDER — ONDANSETRON HCL 4 MG PO TABS: 4.0000 mg | ORAL_TABLET | Freq: Four times a day (QID) | ORAL | Status: DC | PRN

## 2024-09-13 MED ORDER — SORBITOL 70 % SOLN
30.0000 mL | Freq: Every day | Status: DC | PRN
  Administered 2024-09-16 – 2024-09-18 (×2): 30 mL via ORAL
  Filled 2024-09-13 (×3): qty 30

## 2024-09-13 MED ORDER — ONDANSETRON HCL 4 MG/2ML IJ SOLN
INTRAMUSCULAR | Status: AC
  Filled 2024-09-13: qty 2

## 2024-09-13 MED ORDER — SUCCINYLCHOLINE CHLORIDE 200 MG/10ML IV SOSY
PREFILLED_SYRINGE | INTRAVENOUS | Status: AC
  Filled 2024-09-13: qty 10

## 2024-09-13 MED ORDER — KETOROLAC TROMETHAMINE 15 MG/ML IJ SOLN
INTRAMUSCULAR | Status: AC
  Filled 2024-09-13: qty 1

## 2024-09-13 MED ORDER — ROCURONIUM BROMIDE 10 MG/ML (PF) SYRINGE
PREFILLED_SYRINGE | INTRAVENOUS | Status: AC
  Filled 2024-09-13: qty 10

## 2024-09-13 MED ORDER — GLYCOPYRROLATE 0.2 MG/ML IJ SOLN
INTRAMUSCULAR | Status: AC
  Filled 2024-09-13: qty 1

## 2024-09-13 MED ORDER — OXYCODONE HCL 5 MG PO TABS
ORAL_TABLET | ORAL | Status: AC
  Filled 2024-09-13: qty 1

## 2024-09-13 MED ORDER — BUPIVACAINE-EPINEPHRINE (PF) 0.5% -1:200000 IJ SOLN
INTRAMUSCULAR | Status: AC
  Filled 2024-09-13: qty 10

## 2024-09-13 MED ORDER — BUPIVACAINE-EPINEPHRINE (PF) 0.5% -1:200000 IJ SOLN
INTRAMUSCULAR | Status: DC | PRN
  Administered 2024-09-13: 6 mL

## 2024-09-13 MED ORDER — SODIUM CHLORIDE 0.9% FLUSH
3.0000 mL | Freq: Two times a day (BID) | INTRAVENOUS | Status: DC
  Administered 2024-09-13 – 2024-09-19 (×12): 3 mL via INTRAVENOUS

## 2024-09-13 MED ORDER — GLYCOPYRROLATE 0.2 MG/ML IJ SOLN
INTRAMUSCULAR | Status: DC | PRN
  Administered 2024-09-13: .2 mg via INTRAVENOUS

## 2024-09-13 MED ORDER — PROPOFOL 1000 MG/100ML IV EMUL
INTRAVENOUS | Status: AC
  Filled 2024-09-13: qty 100

## 2024-09-13 MED ORDER — POLYETHYLENE GLYCOL 3350 17 G PO PACK
17.0000 g | PACK | Freq: Every day | ORAL | Status: DC | PRN
  Administered 2024-09-16: 17 g via ORAL
  Filled 2024-09-13 (×2): qty 1

## 2024-09-13 MED ORDER — METFORMIN HCL 500 MG PO TABS
500.0000 mg | ORAL_TABLET | Freq: Every day | ORAL | Status: DC
  Administered 2024-09-14 – 2024-09-19 (×6): 500 mg via ORAL
  Filled 2024-09-13 (×6): qty 1

## 2024-09-13 MED ORDER — VANCOMYCIN HCL 1000 MG IV SOLR
INTRAVENOUS | Status: DC | PRN
  Administered 2024-09-13: 1000 mg via TOPICAL

## 2024-09-13 MED ORDER — ACETAMINOPHEN 650 MG RE SUPP: 650.0000 mg | RECTAL | Status: DC | PRN

## 2024-09-13 MED ORDER — ACETAMINOPHEN 325 MG PO TABS
650.0000 mg | ORAL_TABLET | ORAL | Status: DC | PRN
  Filled 2024-09-13: qty 2

## 2024-09-13 MED ORDER — MENTHOL 3 MG MT LOZG: 1.0000 | LOZENGE | OROMUCOSAL | Status: DC | PRN

## 2024-09-13 MED ORDER — KETOROLAC TROMETHAMINE 15 MG/ML IJ SOLN
15.0000 mg | Freq: Four times a day (QID) | INTRAMUSCULAR | Status: AC
  Administered 2024-09-13 – 2024-09-14 (×4): 15 mg via INTRAVENOUS
  Filled 2024-09-13 (×3): qty 1

## 2024-09-13 MED ORDER — PHENYLEPHRINE HCL-NACL 20-0.9 MG/250ML-% IV SOLN
INTRAVENOUS | Status: DC | PRN
  Administered 2024-09-13: 160 ug via INTRAVENOUS
  Administered 2024-09-13: 25 ug/min via INTRAVENOUS
  Administered 2024-09-13: 80 ug via INTRAVENOUS

## 2024-09-13 MED ORDER — DEXMEDETOMIDINE HCL IN NACL 80 MCG/20ML IV SOLN
INTRAVENOUS | Status: DC | PRN
  Administered 2024-09-13 (×3): 8 ug via INTRAVENOUS
  Administered 2024-09-13: 4 ug via INTRAVENOUS

## 2024-09-13 MED ORDER — OXYCODONE HCL 5 MG PO TABS
5.0000 mg | ORAL_TABLET | ORAL | Status: DC | PRN
  Administered 2024-09-13 – 2024-09-19 (×3): 5 mg via ORAL
  Filled 2024-09-13 (×2): qty 1

## 2024-09-13 MED ORDER — KETAMINE HCL 50 MG/5ML IJ SOSY
PREFILLED_SYRINGE | INTRAMUSCULAR | Status: AC
  Filled 2024-09-13: qty 5

## 2024-09-13 MED ORDER — ONDANSETRON HCL 4 MG/2ML IJ SOLN: 4.0000 mg | Freq: Four times a day (QID) | INTRAMUSCULAR | Status: DC | PRN

## 2024-09-13 MED ORDER — PHENOL 1.4 % MT LIQD: 1.0000 | OROMUCOSAL | Status: DC | PRN

## 2024-09-13 MED ORDER — REMIFENTANIL HCL 1 MG IV SOLR
INTRAVENOUS | Status: DC | PRN
  Administered 2024-09-13: .15 ug/kg/min via INTRAVENOUS
  Administered 2024-09-13: 100 ug via INTRAVENOUS

## 2024-09-13 MED ORDER — CHLORHEXIDINE GLUCONATE 0.12 % MT SOLN
OROMUCOSAL | Status: AC
  Filled 2024-09-13: qty 15

## 2024-09-13 MED ORDER — OXYCODONE HCL 5 MG PO TABS
10.0000 mg | ORAL_TABLET | ORAL | Status: DC | PRN
  Administered 2024-09-13 – 2024-09-18 (×11): 10 mg via ORAL
  Filled 2024-09-13 (×12): qty 2

## 2024-09-13 MED ORDER — SODIUM CHLORIDE 0.9 % IV SOLN: INTRAVENOUS | Status: DC

## 2024-09-13 MED ORDER — ROCURONIUM BROMIDE 10 MG/ML (PF) SYRINGE
PREFILLED_SYRINGE | INTRAVENOUS | Status: DC | PRN
  Administered 2024-09-13: 5 mg via INTRAVENOUS

## 2024-09-13 MED ORDER — ONDANSETRON HCL 4 MG/2ML IJ SOLN
INTRAMUSCULAR | Status: DC | PRN
  Administered 2024-09-13: 4 mg via INTRAVENOUS

## 2024-09-13 MED ORDER — HYDROMORPHONE HCL 1 MG/ML IJ SOLN: 0.5000 mg | INTRAMUSCULAR | Status: DC | PRN

## 2024-09-13 MED ORDER — MIDAZOLAM HCL (PF) 2 MG/2ML IJ SOLN
INTRAMUSCULAR | Status: DC | PRN
  Administered 2024-09-13: 2 mg via INTRAVENOUS

## 2024-09-13 MED ORDER — BUPIVACAINE HCL (PF) 0.5 % IJ SOLN
INTRAMUSCULAR | Status: AC
  Filled 2024-09-13: qty 60

## 2024-09-13 MED ORDER — FENTANYL CITRATE (PF) 100 MCG/2ML IJ SOLN: 25.0000 ug | INTRAMUSCULAR | Status: DC | PRN

## 2024-09-13 MED ORDER — LIDOCAINE HCL (PF) 2 % IJ SOLN
INTRAMUSCULAR | Status: DC | PRN
  Administered 2024-09-13: 100 mg via INTRADERMAL

## 2024-09-13 MED ORDER — ACETAMINOPHEN 10 MG/ML IV SOLN
INTRAVENOUS | Status: AC
  Filled 2024-09-13: qty 100

## 2024-09-13 MED ORDER — PROPOFOL 10 MG/ML IV BOLUS
INTRAVENOUS | Status: DC | PRN
  Administered 2024-09-13: 160 mg via INTRAVENOUS
  Administered 2024-09-13: 40 mg via INTRAVENOUS
  Administered 2024-09-13: 20 mg via INTRAVENOUS
  Administered 2024-09-13: 30 mg via INTRAVENOUS
  Administered 2024-09-13: 120 ug/kg/min via INTRAVENOUS

## 2024-09-13 MED ORDER — METHOCARBAMOL 500 MG PO TABS
500.0000 mg | ORAL_TABLET | Freq: Four times a day (QID) | ORAL | Status: DC | PRN
  Administered 2024-09-15 – 2024-09-17 (×2): 500 mg via ORAL
  Filled 2024-09-13 (×2): qty 1

## 2024-09-13 MED ORDER — MIDAZOLAM HCL 2 MG/2ML IJ SOLN
INTRAMUSCULAR | Status: AC
  Filled 2024-09-13: qty 2

## 2024-09-13 MED ORDER — ALBUTEROL SULFATE (2.5 MG/3ML) 0.083% IN NEBU: 3.0000 mL | INHALATION_SOLUTION | Freq: Four times a day (QID) | RESPIRATORY_TRACT | Status: DC | PRN

## 2024-09-13 MED ORDER — DULOXETINE HCL 20 MG PO CPEP
40.0000 mg | ORAL_CAPSULE | ORAL | Status: DC
  Administered 2024-09-14 – 2024-09-19 (×6): 40 mg via ORAL
  Filled 2024-09-13 (×6): qty 2

## 2024-09-13 MED ORDER — SODIUM CHLORIDE (PF) 0.9 % IJ SOLN
INTRAMUSCULAR | Status: AC
  Filled 2024-09-13: qty 40

## 2024-09-13 MED ORDER — CHLORHEXIDINE GLUCONATE CLOTH 2 % EX PADS
6.0000 | MEDICATED_PAD | Freq: Every day | CUTANEOUS | Status: DC
  Administered 2024-09-14 – 2024-09-18 (×5): 6 via TOPICAL

## 2024-09-13 MED ORDER — VANCOMYCIN HCL IN DEXTROSE 1-5 GM/200ML-% IV SOLN
INTRAVENOUS | Status: AC
  Filled 2024-09-13: qty 200

## 2024-09-13 MED ORDER — 0.9 % SODIUM CHLORIDE (POUR BTL) OPTIME
TOPICAL | Status: DC | PRN
  Administered 2024-09-13: 500 mL

## 2024-09-13 MED ORDER — SODIUM CHLORIDE (PF) 0.9 % IJ SOLN
INTRAMUSCULAR | Status: DC | PRN
  Administered 2024-09-13: 60 mL via INTRAMUSCULAR

## 2024-09-13 MED ORDER — CEFAZOLIN IN SODIUM CHLORIDE 2-0.9 GM/100ML-% IV SOLN
2.0000 g | Freq: Once | INTRAVENOUS | Status: AC
  Administered 2024-09-13: 2 g via INTRAVENOUS

## 2024-09-13 MED ORDER — CEFAZOLIN SODIUM-DEXTROSE 2-4 GM/100ML-% IV SOLN
INTRAVENOUS | Status: AC
  Filled 2024-09-13: qty 100

## 2024-09-13 MED ORDER — SODIUM CHLORIDE 0.9% FLUSH: 3.0000 mL | INTRAVENOUS | Status: DC | PRN

## 2024-09-13 MED ORDER — IRRISEPT - 450ML BOTTLE WITH 0.05% CHG IN STERILE WATER, USP 99.95% OPTIME
TOPICAL | Status: DC | PRN
  Administered 2024-09-13: 450 mL via TOPICAL

## 2024-09-13 MED ORDER — KETAMINE HCL 50 MG/5ML IJ SOSY
PREFILLED_SYRINGE | INTRAMUSCULAR | Status: DC | PRN
  Administered 2024-09-13 (×2): 10 mg via INTRAVENOUS
  Administered 2024-09-13: 30 mg via INTRAVENOUS

## 2024-09-13 MED ORDER — BUPIVACAINE LIPOSOME 1.3 % IJ SUSP
INTRAMUSCULAR | Status: AC
  Filled 2024-09-13: qty 20

## 2024-09-13 MED ORDER — LIDOCAINE HCL (PF) 2 % IJ SOLN
INTRAMUSCULAR | Status: AC
  Filled 2024-09-13: qty 5

## 2024-09-13 SURGICAL SUPPLY — 53 items
ALLOGRAFT BONESTRIP KORE 2.5X5 (Bone Implant) IMPLANT
BASIN KIT SINGLE STR (MISCELLANEOUS) ×2 IMPLANT
BUR NEURO DRILL SOFT 3.0X3.8M (BURR) ×2 IMPLANT
DERMABOND ADVANCED .7 DNX12 (GAUZE/BANDAGES/DRESSINGS) ×2 IMPLANT
DRAPE C ARM PK CFD 31 SPINE (DRAPES) ×2 IMPLANT
DRAPE C-ARMOR (DRAPES) IMPLANT
DRAPE LAPAROTOMY 100X77 ABD (DRAPES) ×2 IMPLANT
DRAPE LAPAROTOMY 77X122 PED (DRAPES) ×2 IMPLANT
DRAPE SCAN PATIENT (DRAPES) ×2 IMPLANT
DRAPE SPINE LEICA/WILD 54X150 (DRAPES) ×2 IMPLANT
DRAPE TABLE BACK 80X90 (DRAPES) ×2 IMPLANT
DRSG OPSITE POSTOP 4X6 (GAUZE/BANDAGES/DRESSINGS) IMPLANT
ELECTRODE REM PT RTRN 9FT ADLT (ELECTROSURGICAL) ×2 IMPLANT
EVACUATOR 1/8 PVC DRAIN (DRAIN) IMPLANT
EX-PIN ORTHOLOCK NAV 4X150 (PIN) IMPLANT
FEE CVG SUPP BRAINLAB NG SPNE (MISCELLANEOUS) ×2 IMPLANT
FEE INTRAOP CADWELL SUPPLY NCS (MISCELLANEOUS) IMPLANT
FEE INTRAOP MONITOR IMPULS NCS (MISCELLANEOUS) IMPLANT
GLOVE BIOGEL PI IND STRL 6.5 (GLOVE) ×2 IMPLANT
GLOVE SURG SYN 6.5 PF PI (GLOVE) ×4 IMPLANT
GLOVE SURG SYN 6.5 PF PI BL (GLOVE) IMPLANT
GLOVE SURG SYN 8.5 PF PI (GLOVE) ×6 IMPLANT
GOWN SRG LRG LVL 4 IMPRV REINF (GOWNS) ×4 IMPLANT
GOWN SRG XL LVL 3 NONREINFORCE (GOWNS) ×2 IMPLANT
GOWN STRL REUS W/ TWL LRG LVL3 (GOWN DISPOSABLE) IMPLANT
HOLDER FOLEY CATH W/STRAP (MISCELLANEOUS) IMPLANT
KIT PREVENA INCISION MGT 13 (CANNISTER) IMPLANT
KIT PREVENA INCISION MGT20CM45 (CANNISTER) IMPLANT
KIT SPINAL PRONEVIEW (KITS) ×2 IMPLANT
KIT TURNOVER KIT A (KITS) ×2 IMPLANT
LAVAGE JET IRRISEPT WOUND (IRRIGATION / IRRIGATOR) ×2 IMPLANT
MANIFOLD NEPTUNE II (INSTRUMENTS) ×2 IMPLANT
MARKER SKIN DUAL TIP RULER LAB (MISCELLANEOUS) ×2 IMPLANT
MARKER SPHERE PSV REFLC 13MM (MARKER) ×14 IMPLANT
NDL SAFETY ECLIP 18X1.5 (MISCELLANEOUS) ×2 IMPLANT
NS IRRIG 500ML POUR BTL (IV SOLUTION) ×2 IMPLANT
PACK LAMINECTOMY ARMC (PACKS) ×2 IMPLANT
PAD ARMBOARD POSITIONER FOAM (MISCELLANEOUS) ×4 IMPLANT
ROD RELINE-O LORD 5.5X40 (Rod) IMPLANT
SCREW LOCK RELINE 5.5 TULIP (Screw) IMPLANT
SCREW RELINE-O POLY 6.5X45 (Screw) IMPLANT
SCREW RELINE-O POLY 6.5X50MM (Screw) IMPLANT
STAPLER SKIN PROX 35W (STAPLE) IMPLANT
SURGIFLO W/THROMBIN 8M KIT (HEMOSTASIS) ×4 IMPLANT
SUT STRATA 3-0 15 PS-2 (SUTURE) ×2 IMPLANT
SUT VIC AB 0 CT1 27XCR 8 STRN (SUTURE) ×4 IMPLANT
SUT VIC AB 2-0 CT1 18 (SUTURE) ×4 IMPLANT
SYR 20ML LL LF (SYRINGE) ×2 IMPLANT
SYR 30ML LL (SYRINGE) ×4 IMPLANT
TAPE CLOTH 3X10 WHT NS LF (GAUZE/BANDAGES/DRESSINGS) ×6 IMPLANT
TRAP FLUID SMOKE EVACUATOR (MISCELLANEOUS) ×2 IMPLANT
TRAY FOLEY SLVR 16FR LF STAT (SET/KITS/TRAYS/PACK) IMPLANT
TUBING CONNECTING 10 (TUBING) IMPLANT

## 2024-09-13 NOTE — Op Note (Addendum)
 Indications: 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   Preoperative Note:   Risks of surgery discussed include: infection, bleeding, stroke, coma, death, paralysis, CSF leak, nerve/spinal cord injury, numbness, tingling, weakness, complex regional pain syndrome, recurrent stenosis and/or disc herniation, vascular injury, development of instability, neck/back pain, need for further surgery, persistent symptoms, development of deformity, and the risks of anesthesia. The patient understood these risks and agreed to proceed.  Operative Note:  1. Transpedicular approach for L T10/11 discectomy 2. Posterolateral arthrodesis T10 to T11 3. Posterior nonsegmental instrumentation T10 to T11 using Nuvasive Reline implants 4. Harvesting of autograft via the same incision 5. Use of stereotaxis    The patient was brought to the Operating Room, intubated and turned into the prone position. All pressure points were checked and double checked. Flouroscopy was used to mark the incision. The patient was prepped and draped in the standard fashion. A full timeout was performed. Preoperative antibiotics were given. The incision was injected with local anesthetic.  The incision was opened with a scalpel, then the soft tissues divided with the Bovie. Self-retaining retractors were placed. The paraspinus muscles were reflected laterally in subperiosteal fashion until the transverse processes were visible. Flouroscopy was used to confirm our localization.  The stereotactic array was placed.   Stereotactic images were acquired and registered to the patient.  We then used stereotactically guided drill guides to cannulate the pedicles bilaterally from T10 to T11.   We then placed the left T10 and R T10 and T11 pedicle screws.  6.5 mm Nuvasive Reline screws were used.   We then used the Horsley rib cutter to remove the T10 and T11 spinous processes as well as the inferior portion of the T9 spinous process.  Using high-speed drill, the superior two thirds of the T10 lamina and the complete T11 lamina were removed.  The left T10 infra articulating process was removed.  The lateral margin of the dura was identified on the left side and carefully protected.  Using the 2 mm punch, the bony edges were removed to allow for safe access to the left side of the dura.  While protecting the dura, the left T11 superior articulating process was divided from the left T10 pedicle.  The superior ~50% of the T10 pedicle was then removed while carefully protecting the dura.  The disc herniation was identified.  Using high-speed drill, a cavity was developed within the superior portion of the T11 vertebral body, the T10-11 disc space, and the inferior portion of the T10 vertebral body via the left-sided access.  The epidural plane was then carefully dissected.  While protecting the dura, the disc herniation was identified and then manipulated into the cavity that had been developed until the dura was no longer compressed.  After removal of the disc herniation, additional calcified ligamentum on the right side of the spinal canal was palpated and removed.  There was also a portion superior to the removal of the lamina which was developed and removed.  At this point, the dura was fully decompressed.  After decompression was complete, hemostasis was achieved.  We then placed the left T11 pedicle screw.  Rods were  measured to length. The rods were secured using locking caps to manufacturer's specifications. Final 3D  imaging was taken to confirm placement of instrumentation and appropriate alignment.   Please note that during placement of rods, there was a loss of signals in the legs from the motor evoked potentials.  He had poor signals to begin with, but we lost the left foot signal.  The mean arterial pressure was raised by 10 points and additional steroids given.  After completion of the decompression, the motor evoked potentials returned to baseline.  The somatosensory evoked potentials remained at baseline the entire case.   The wound was copiously irrigated, then the external surfaces of the remaining lamina, facet, and transverse processes from T10 to T11 were decorticated. A mixture of allograft and autograft was placed over the decorticated surfaces for arthrodesis.  A drain was placed subfascially.   After hemostasis, the wound was closed in layers with 0 and 2-0 vicryl. Staples were applied to the incision.  The patient was then flipped supine and extubated with incident. All counts were correct times 2 at the end of the case. No immediate complications were noted.  Johnny Goods PA assisted in the entire procedure. An assistant was required for this procedure due to the complexity.  The assistant provided assistance in tissue manipulation and suction, and was required for the successful and safe performance of the procedure. I performed the critical portions of the procedure.   Johnny Daisy MD

## 2024-09-13 NOTE — Discharge Instructions (Addendum)
  Your surgeon has performed an operation on your thoracic spine to relieve pressure on one or more nerves. Many times, patients feel better immediately after surgery and can "overdo it." Even if you feel well, it is important that you follow these activity guidelines. If you do not let your back heal properly from the surgery, you can increase the chance of hardware complications and/or return of your symptoms. The following are instructions to help in your recovery once you have been discharged from the hospital.  Do not use NSAIDs for 3 months after surgery. OK to use celebrex . *Regarding compression stockings-  Please wear day and night until you are walking a couple hundred feet three times a day.   Activity    No bending, lifting, or twisting ("BLT"). Avoid lifting objects heavier than 10 pounds (gallon milk jug).  Where possible, avoid household activities that involve lifting, bending, pushing, or pulling such as laundry, vacuuming, grocery shopping, and childcare. Try to arrange for help from friends and family for these activities while your back heals.  Increase physical activity slowly as tolerated.  Taking short walks is encouraged, but avoid strenuous exercise. Do not jog, run, bicycle, lift weights, or participate in any other exercises unless specifically allowed by your doctor. Avoid prolonged sitting, including car rides.  Talk to your doctor before resuming sexual activity.  You should not drive until cleared by your doctor.  Until released by your doctor, you should not return to work or school.  You should rest at home and let your body heal.   You may shower three days after your surgery.  Please keep your dressing dry.  If you smoke, we strongly recommend that you quit.  Smoking has been proven to interfere with normal healing in your back and will dramatically reduce the success rate of your surgery. Please contact [facility] (800-QUIT-NOW) and use the resources at  www.[facility].com for assistance in stopping smoking.  Surgical Incision   We will arrange for removal of your drain early next week.  Diet            You may return to your usual diet. Be sure to stay hydrated.  When to Contact Us   Although your surgery and recovery will likely be uneventful, you may have some residual numbness, aches, and pains in your back and/or legs. This is normal and should improve in the next few weeks.  However, should you experience any of the following, contact us  immediately: New numbness or weakness Pain that is progressively getting worse, and is not relieved by your pain medications or rest Bleeding, redness, swelling, pain, or drainage from surgical incision Chills or flu-like symptoms Fever greater than 101.0 F (38.3 C) Problems with bowel or bladder functions Difficulty breathing or shortness of breath Warmth, tenderness, or swelling in your calf  Contact Information How to contact us :  If you have any questions/concerns before or after surgery, you can reach us  at [phone], or you can send a mychart message. We can be reached by phone or mychart 8am-4pm, Monday-Friday.  *Please note: Calls after 4pm are forwarded to a third party answering service. Mychart messages are not routinely monitored during evenings, weekends, and holidays. Please call our office to contact the answering service for urgent concerns during non-business hours.

## 2024-09-13 NOTE — Transfer of Care (Signed)
 Immediate Anesthesia Transfer of Care Note  Patient: Johnny Gonzales  Procedure(s) Performed: OPEN LEFT T10-11 TRANSPEDICULAR DISCECTOMY AND FUSION (Left: Spine Thoracic) POSTERIOR THORACIC FUSION 1 LEVEL (Left: Spine Thoracic) APPLICATION OF INTRAOPERATIVE CT SCAN (Spine Thoracic)  Patient Location: PACU  Anesthesia Type:General  Level of Consciousness: sedated  Airway & Oxygen Therapy: Patient Spontanous Breathing and Patient connected to face mask oxygen  Post-op Assessment: Report given to RN and Post -op Vital signs reviewed and stable  Post vital signs: Reviewed and stable  Last Vitals:  Vitals Value Taken Time  BP 110/73 09/13/24 14:35  Temp    Pulse 76 09/13/24 14:39  Resp 15 09/13/24 14:39  SpO2 99 % 09/13/24 14:39  Vitals shown include unfiled device data.  Last Pain:  Vitals:   09/13/24 0953  TempSrc: Temporal  PainSc: 0-No pain         Complications: No notable events documented.

## 2024-09-13 NOTE — Anesthesia Preprocedure Evaluation (Signed)
 Anesthesia Evaluation  Patient identified by MRN, date of birth, ID band Patient awake    Reviewed: Allergy & Precautions, H&P , NPO status , Patient's Chart, lab work & pertinent test results, reviewed documented beta blocker date and time   History of Anesthesia Complications Negative for: history of anesthetic complications  Airway Mallampati: III  TM Distance: >3 FB Neck ROM: full    Dental  (+) Teeth Intact, Caps, Missing, Chipped   Pulmonary shortness of breath and with exertion, neg sleep apnea, neg COPD, neg recent URI, Current Smoker and Patient abstained from smoking.   Pulmonary exam normal breath sounds clear to auscultation       Cardiovascular negative cardio ROS Normal cardiovascular exam Rhythm:regular Rate:Normal     Neuro/Psych negative neurological ROS  negative psych ROS   GI/Hepatic negative GI ROS,,,(+)     substance abuse  cocaine use  Endo/Other  diabetes (pre-diabetic)    Renal/GU negative Renal ROS  negative genitourinary   Musculoskeletal   Abdominal   Peds  Hematology negative hematology ROS (+)   Anesthesia Other Findings Past Medical History:   Back pain                                                   Perineal Cellulitis with Malaise, Leukocytosis, and Refractory Pain  Signs and symptoms suggestive of sleep apnea     Reproductive/Obstetrics negative OB ROS                              Anesthesia Physical Anesthesia Plan  ASA: 2  Anesthesia Plan: General   Post-op Pain Management:    Induction: Intravenous  PONV Risk Score and Plan: 1 and Ondansetron , Dexamethasone , Midazolam  and Treatment may vary due to age or medical condition  Airway Management Planned: Oral ETT  Additional Equipment:   Intra-op Plan:   Post-operative Plan: Extubation in OR  Informed Consent: I have reviewed the patients History and Physical, chart, labs and  discussed the procedure including the risks, benefits and alternatives for the proposed anesthesia with the patient or authorized representative who has indicated his/her understanding and acceptance.     Dental Advisory Given  Plan Discussed with: Anesthesiologist, CRNA and Surgeon  Anesthesia Plan Comments:          Anesthesia Quick Evaluation

## 2024-09-13 NOTE — Anesthesia Procedure Notes (Signed)
 Procedure Name: Intubation Date/Time: 09/13/2024 11:29 AM  Performed by: Carley Pac, RNPre-anesthesia Checklist: Patient identified, Emergency Drugs available, Suction available and Patient being monitored Patient Re-evaluated:Patient Re-evaluated prior to induction Oxygen Delivery Method: Circle System Utilized Preoxygenation: Pre-oxygenation with 100% oxygen Induction Type: IV induction Ventilation: Mask ventilation without difficulty Laryngoscope Size: McGrath and 4 Grade View: Grade I Tube type: Oral Tube size: 8.0 mm Number of attempts: 1 Airway Equipment and Method: Stylet and Oral airway Placement Confirmation: ETT inserted through vocal cords under direct vision, positive ETCO2 and breath sounds checked- equal and bilateral Secured at: 25 cm Tube secured with: Tape Dental Injury: Teeth and Oropharynx as per pre-operative assessment

## 2024-09-13 NOTE — H&P (Signed)
 Referring Physician:  Clois Fret, MD [address] [place],  [place] [address]  Primary Physician:  Kotturi, Vinay K, MD  History of Present Illness: 09/13/2024 [name] presents for surgical intervention.  08/17/2024 [name] is here today with a chief complaint of  difficulty walking and numbness in both legs.  He has experienced difficulty walking for six months, with symptoms occasionally worsening. He denies recent trips or falls. He began using a mobility aid a couple of days ago, having previously used a walker. Others have expressed concern about his ability to walk without assistance.  He experiences numbness and weakness in both legs, with varying sensation in different areas.  He has a long history of smoking, approximately 40 years, currently smoking a pack a week.   Discussed the use of AI scribe software for clinical note transcription with the patient, who gave verbal consent to proceed.  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  The symptoms are causing a significant impact on the patient's life.   I have utilized the care everywhere function in epic to review the outside records available from external health systems.  Progress Note from [place], [place] on 08/10/24:  History of Present Illness: 08/10/2024 [name] is here today with a chief complaint of acute on chronic back pain.  He has had back pain for quite some time, but over the past several months he is gotten rapidly, progressively worse.  He is having significant pain that radiates down both legs.  He feels as though it is the entirety of both legs with burning sensation, numbness, tingling down to his feet.  He now has weakness and newly had a start walking with a walker secondary to inability to ambulate well.  His legs bother him more than his back.  He does have minimal relief from leaning forward.  Feels as though his right  leg is worse than the left.  It is constant and sharp.  He states that he has had some numbness at his anus for several months now.  Denies any incontinence.       Weakness: none   Bowel/Bladder Dysfunction: none   Conservative measures:  Physical therapy: Has not participated in PT. Multimodal medical therapy including regular antiinflammatories: Meloxicam , Methocarbamol , Cymbalta ,  prednisone  Injections: No epidural steroid injections.  Review of Systems:  A 10 point review of systems is negative, except for the pertinent positives and negatives detailed in the HPI.  Past Medical History: Past Medical History:  Diagnosis Date   Back pain    Cigarette smoker    Dyspnea    History of cocaine use    Hyperlipidemia    Lumbar spinal stenosis    Lumbar spondylosis    Pre-diabetes     Past Surgical History: Past Surgical History:  Procedure Laterality Date   HAND SURGERY     INCISION AND DRAINAGE ABSCESS N/A 04/30/2015   Procedure: INCISION AND DRAINAGE ABSCESS/PERINEAL ABSCESS;  Surgeon: Rosina Riis, MD;  Location: [facility] ORS;  Service: Urology;  Laterality: N/A;    Allergies: Allergies as of 08/17/2024   (No Known Allergies)    Medications:  Current Facility-Administered Medications:    0.9 %  sodium chloride  infusion, , Intravenous, Continuous, Wohl, Darren, MD   ceFAZolin (ANCEF) IVPB 2g/100 mL premix, 2 g, Intravenous, Once, Clois Fret, MD   lactated ringers  infusion, , Intravenous, Continuous, Vicci Camellia Glatter, MD   vancomycin  (VANCOCIN ) IVPB 1000 mg/200  mL premix, 1,000 mg, Intravenous, Once, Clois Fret, MD, Last Rate: 200 mL/hr at 09/13/24 1033, 1,000 mg at 09/13/24 1033  Social History: Social History   Tobacco Use   Smoking status: Every Day    Current packs/day: 2.00    Types: Cigarettes   Smokeless tobacco: Never  Vaping Use   Vaping status: Never Used  Substance Use Topics   Alcohol use: Not Currently    Alcohol/week: 2.0  standard drinks of alcohol    Types: 2 Cans of beer per week   Drug use: Not Currently    Types: Cocaine    Family Medical History: Family History  Problem Relation Age of Onset   Stroke Father    Heart attack Brother    Heart attack Mother    Bladder Cancer Neg Hx    Prostate cancer Neg Hx    Kidney cancer Neg Hx     Physical Examination: Vitals:   09/13/24 0953  BP: 136/86  Pulse: 68  Resp: 16  Temp: (!) 97.3 F (36.3 C)  SpO2: 100%   Heart sounds normal no MRG. Chest Clear to Auscultation Bilaterally.  General: Patient is in no apparent distress. Attention to examination is appropriate.  Neck:   Supple.  Full range of motion.  Respiratory: Patient is breathing without any difficulty.   NEUROLOGICAL:     Awake, alert, oriented to person, place, and time.  Speech is clear and fluent.   Cranial Nerves: Pupils equal round and reactive to light.  Facial tone is symmetric.  Facial sensation is symmetric. Shoulder shrug is symmetric. Tongue protrusion is midline.  There is no pronator drift.  Strength: Side Biceps Triceps Deltoid Interossei Grip Wrist Ext. Wrist Flex.  R 5 5 5 5 5 5 5   L 5 5 5 5 5 5 5    Side Iliopsoas Quads Hamstring PF DF EHL  R 4 4 4  4+ 5 5  L 4- 4 4 4+ 5 5   Reflexes are 1+ and symmetric at the biceps, triceps, brachioradialis, 3+patella and  3+achilles.   Hoffman's is absent.  He has 2 beats of clonus bilaterally  Bilateral upper and lower extremity sensation is symmetric to light touch.   He has sensation in both of his lower extremities is diminished compared to his upper extremities. No evidence of dysmetria noted.   Medical Decision Making  Imaging: MRI T spine 08/16/2024 IMPRESSION: 1. Severe canal stenosis at T10-T11 with mass effect on the cord and associated cord edema or myelomalacia. 2. Severe foraminal stenosis on the right at C3-C4, C4-C5, and left at C5-C6. At C6-C7, moderate to severe bilateral foraminal stenosis.  Multilevel moderate foraminal stenosis in the cervical spine as above. 3. Moderate to severe foraminal stenosis bilaterally at T8-T9, T10-T11. Moderate bilateral foraminal stenosis at T5-T6. 4. Moderate canal stenosis at C3-C4.  Multilevel mild canal stenosis.   Electronically signed by: Gilmore Molt MD 08/16/2024 02:52 PM EST RP Workstation: [id number]  I have personally reviewed the images and agree with the above interpretation.  Assessment and Plan: Johnny Gonzales is a pleasant 64 y.o. 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  He has progressively worsening symptoms.  There is no role for conservative management for thoracic myelopathy with myelomalacia.  I recommend surgical intervention with a left-sided T10-11 transpedicular discectomy and T10-11 instrumentation.     Charmika Macdonnell K. Clois MD, [facility] Neurosurgery

## 2024-09-13 NOTE — Plan of Care (Signed)

## 2024-09-14 ENCOUNTER — Encounter: Payer: Self-pay | Admitting: Neurosurgery

## 2024-09-14 LAB — CREATININE, SERUM
Creatinine, Ser: 1.03 mg/dL (ref 0.61–1.24)
GFR, Estimated: >60 mL/min (ref >60)

## 2024-09-14 LAB — GLUCOSE, CAPILLARY
Glucose-Capillary: 302 mg/dL — ABNORMAL HIGH (ref 70–99)
Glucose-Capillary: 250 mg/dL — ABNORMAL HIGH (ref 70–99)

## 2024-09-14 MED ORDER — CHLORPROMAZINE HCL 10 MG PO TABS
10.0000 mg | ORAL_TABLET | Freq: Four times a day (QID) | ORAL | Status: DC | PRN
  Administered 2024-09-14 – 2024-09-18 (×6): 10 mg via ORAL
  Filled 2024-09-14 (×7): qty 1

## 2024-09-14 MED ORDER — INSULIN ASPART 100 UNIT/ML IJ SOLN
0.0000 [IU] | Freq: Every day | INTRAMUSCULAR | Status: DC
  Administered 2024-09-14: 2 [IU] via SUBCUTANEOUS
  Filled 2024-09-14: qty 2

## 2024-09-14 MED ORDER — INSULIN ASPART 100 UNIT/ML IJ SOLN
0.0000 [IU] | Freq: Three times a day (TID) | INTRAMUSCULAR | Status: DC
  Administered 2024-09-14: 11 [IU] via SUBCUTANEOUS
  Administered 2024-09-15: 3 [IU] via SUBCUTANEOUS
  Administered 2024-09-15: 5 [IU] via SUBCUTANEOUS
  Administered 2024-09-15 – 2024-09-16 (×2): 2 [IU] via SUBCUTANEOUS
  Administered 2024-09-16: 5 [IU] via SUBCUTANEOUS
  Administered 2024-09-16: 3 [IU] via SUBCUTANEOUS
  Administered 2024-09-17 – 2024-09-18 (×5): 2 [IU] via SUBCUTANEOUS
  Administered 2024-09-19: 3 [IU] via SUBCUTANEOUS
  Filled 2024-09-14: qty 11
  Filled 2024-09-14 (×3): qty 2
  Filled 2024-09-14: qty 3
  Filled 2024-09-14 (×2): qty 2
  Filled 2024-09-14 (×3): qty 5
  Filled 2024-09-14: qty 2
  Filled 2024-09-14: qty 3
  Filled 2024-09-14: qty 2

## 2024-09-14 MED ORDER — CALCIUM CARBONATE ANTACID 500 MG PO CHEW: 1.0000 | CHEWABLE_TABLET | ORAL | Status: DC | PRN

## 2024-09-14 NOTE — Progress Notes (Signed)
 Pt A&OX4. Incision pain controlled with PRN's. Pt has full sensation and can move all extremities (though he states his lower extremities feel funny). Incision site covered with wound vac and has small to moderate sanguinous drainage to site but is intact. Accordion drain intact. Tolerating POs and voiding without trouble.

## 2024-09-14 NOTE — Progress Notes (Signed)
 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  His legs are feeling stronger.  He still has some altered sensation in his feet.  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  He is doing well.  - mobilize - pain control - DVT prophylaxis - PTOT   Reeves Daisy MD, [facility] Department of Neurosurgery

## 2024-09-14 NOTE — Evaluation (Signed)
 Occupational Therapy Evaluation Patient Details Name: Johnny Gonzales MRN: [id number] DOB: Feb 04, 1961 Today's Date: 09/14/2024   History of Present Illness   [name] is here today with a chief complaint of  difficulty walking and numbness in both legs. He is s/p TLIF. No brace needed.     Clinical Impressions Patient presenting with decreased Ind in self care,balance, functional mobility, transfers, endurance, and safety awareness.  Patient reports being Mod I with mobility with use of SPC at baseline and living with 2 other roommates. Pt endorses being Ind with self care needs.  Patient currently functioning at min A of 2 to stand from recliner chair and ambulate 300' with use of RW. Pt does have to takes stopped standing rest break but does well otherall. Pt returning to recliner chair at end of session. Call bell and all needed items within reach. Pt able to demonstrate use of figure four and circle sitting technique to thread LB clothing ont B feet. Call bell and all needed items within reach upon exiting the room.  Patient will benefit from acute OT to increase overall independence in the areas of ADLs, functional mobility, and safety awareness in order to safely discharge.     If plan is discharge home, recommend the following:   A little help with walking and/or transfers;A little help with bathing/dressing/bathroom;Assistance with cooking/housework;Assist for transportation;Help with stairs or ramp for entrance     Functional Status Assessment   Patient has had a recent decline in their functional status and demonstrates the ability to make significant improvements in function in a reasonable and predictable amount of time.     Equipment Recommendations   BSC/3in1;Other (comment) (2WW)      Precautions/Restrictions   Precautions Precautions: Fall;Back Recall of Precautions/Restrictions: Intact     Mobility Bed Mobility               General bed mobility  comments: NT patient in recliner    Transfers Overall transfer level: Needs assistance Equipment used: Rolling walker (2 wheels) Transfers: Sit to/from Stand Sit to Stand: Min assist, +2 physical assistance                  Balance Overall balance assessment: Needs assistance Sitting-balance support: Feet supported Sitting balance-Leahy Scale: Good     Standing balance support: Bilateral upper extremity supported, During functional activity, Reliant on assistive device for balance Standing balance-Leahy Scale: Good                             ADL either performed or assessed with clinical judgement   ADL Overall ADL's : Needs assistance/impaired                                       General ADL Comments: Pt demonstrates ability to perform figure four technique and circle sitting to thread LB clothing onto feet for LB clothing management to maintain precautions.     Vision Ability to See in Adequate Light: 1 Impaired Patient Visual Report: No change from baseline              Pertinent Vitals/Pain Pain Assessment Pain Assessment: 0-10 Pain Score: 2  Pain Location: back with walking Pain Descriptors / Indicators: Discomfort, Sore Pain Intervention(s): Limited activity within patient's tolerance, Monitored during session, Repositioned     Extremity/Trunk Assessment Upper Extremity Assessment  Upper Extremity Assessment: Defer to OT evaluation   Lower Extremity Assessment Lower Extremity Assessment: Generalized weakness   Cervical / Trunk Assessment Cervical / Trunk Assessment: Back Surgery   Communication Communication Communication: No apparent difficulties   Cognition Arousal: Alert Behavior During Therapy: WFL for tasks assessed/performed Cognition: No apparent impairments                               Following commands: Intact       Cueing  General Comments   Cueing Techniques: Verbal cues               Home Living Family/patient expects to be discharged to:: Private residence Living Arrangements: Non-relatives/Friends (2 other roommates) Available Help at Discharge: Available PRN/intermittently Type of Home: House Home Access: Stairs to enter [employer] of Steps: 2-3 Entrance Stairs-Rails: Right Home Layout: One level     Bathroom Shower/Tub: Tub/shower unit         Home Equipment: Cane - single Librarian, Academic (2 wheels)   Additional Comments: states his walker is worn out      Prior Functioning/Environment Prior Level of Function : Independent/Modified Independent             Mobility Comments: used SPC prior to admission ADLs Comments: independent    OT Problem List: Decreased strength;Impaired balance (sitting and/or standing);Decreased safety awareness;Decreased activity tolerance;Decreased knowledge of use of DME or AE;Decreased knowledge of precautions   OT Treatment/Interventions: Self-care/ADL training;Manual therapy;Therapeutic exercise;Balance training;Energy conservation;Therapeutic activities      OT Goals(Current goals can be found in the care plan section)   Acute Rehab OT Goals Patient Stated Goal: to go home OT Goal Formulation: With patient Time For Goal Achievement: 09/28/24 Potential to Achieve Goals: Fair ADL Goals Pt Will Perform Grooming: with modified independence;standing Pt Will Perform Lower Body Dressing: with modified independence;sit to/from stand Pt Will Transfer to Toilet: with modified independence;ambulating Pt Will Perform Toileting - Clothing Manipulation and hygiene: with modified independence;sit to/from stand   OT Frequency:  Min 2X/week    Co-evaluation PT/OT/SLP Co-Evaluation/Treatment: Yes Reason for Co-Treatment: Necessary to address cognition/behavior during functional activity;For patient/therapist safety;To address functional/ADL transfers PT goals addressed during session:  Mobility/safety with mobility;Balance;Proper use of DME OT goals addressed during session: ADL's and self-care      AM-PAC OT 6 Clicks Daily Activity     Outcome Measure Help from another person eating meals?: None Help from another person taking care of personal grooming?: A Little Help from another person toileting, which includes using toliet, bedpan, or urinal?: A Little Help from another person bathing (including washing, rinsing, drying)?: A Little Help from another person to put on and taking off regular upper body clothing?: A Little Help from another person to put on and taking off regular lower body clothing?: A Little 6 Click Score: 19   End of Session Equipment Utilized During Treatment: Rolling walker (2 wheels) Nurse Communication: Mobility status  Activity Tolerance: Patient tolerated treatment well Patient left: in bed;with call bell/phone within reach;with bed alarm set  OT Visit Diagnosis: Unsteadiness on feet (R26.81);Muscle weakness (generalized) (M62.81)                Time: 9059-9042 OT Time Calculation (min): 17 min Charges:  OT General Charges $OT Visit: 1 Visit  Izetta Claude, MS, OTR/L , CBIS ascom [phone]  09/14/24, 1:28 PM

## 2024-09-14 NOTE — Evaluation (Signed)
 Physical Therapy Evaluation Patient Details Name: Johnny Gonzales MRN: [id number] DOB: [birth year] Today's Date: 09/14/2024  History of Present Illness  [name] is here today with a chief complaint of  difficulty walking and numbness in both legs. He is s/p TLIF. No brace needed.  Clinical Impression  Patient received in recliner. He reports he is feeling well. Agrees to PT/OT session. Patient requires cues for sit to stand ( hand placement and scooting to edge of chair). He is able to stand with min A +2 and ambulated ~200 feet with RW min A +2 for safety and lines. Patient did get fatigued during this and required 2 brief standing rest breaks. He will continue to benefit from skilled PT to improve independence and safety with mobility.        If plan is discharge home, recommend the following: A little help with walking and/or transfers;A little help with bathing/dressing/bathroom   Can travel by private vehicle    yes    Equipment Recommendations Rolling walker (2 wheels)  Recommendations for Other Services       Functional Status Assessment Patient has had a recent decline in their functional status and demonstrates the ability to make significant improvements in function in a reasonable and predictable amount of time.     Precautions / Restrictions Precautions Precautions: Fall;Back Recall of Precautions/Restrictions: Intact Restrictions Weight Bearing Restrictions Per Provider Order: No      Mobility  Bed Mobility               General bed mobility comments: NT patient in recliner    Transfers Overall transfer level: Needs assistance Equipment used: Rolling walker (2 wheels) Transfers: Sit to/from Stand Sit to Stand: Min assist, +2 physical assistance           General transfer comment: cues for hand placement, to scoot to edge of chair.    Ambulation/Gait Ambulation/Gait assistance: Contact guard assist, +2 safety/equipment Gait Distance (Feet):  200 Feet Assistive device: Rolling walker (2 wheels) Gait Pattern/deviations: Step-through pattern, Decreased step length - right, Decreased step length - left, Decreased stride length Gait velocity: mildly decreased     General Gait Details: ambulated with safe use of RW. Mild pain. reports he is feeling good during ambulation.  Stairs            Wheelchair Mobility     Tilt Bed    Modified Rankin (Stroke Patients Only)       Balance Overall balance assessment: Needs assistance Sitting-balance support: Feet supported Sitting balance-Leahy Scale: Good     Standing balance support: Bilateral upper extremity supported, During functional activity, Reliant on assistive device for balance Standing balance-Leahy Scale: Good                               Pertinent Vitals/Pain Pain Assessment Pain Assessment: 0-10 Pain Score: 2  Pain Location: back with walking Pain Descriptors / Indicators: Discomfort, Sore Pain Intervention(s): Monitored during session, Premedicated before session, Repositioned    Home Living Family/patient expects to be discharged to:: Private residence Living Arrangements: Non-relatives/Friends Available Help at Discharge: Available PRN/intermittently Type of Home: House Home Access: Stairs to enter Entrance Stairs-Rails: Right Entrance Stairs-Number of Steps: 2-3   Home Layout: One level Home Equipment: Cane - single Librarian, Academic (2 wheels) Additional Comments: states his walker is worn out    Prior Function Prior Level of Function : Independent/Modified Independent  Mobility Comments: used SPC prior to admission ADLs Comments: independent     Extremity/Trunk Assessment   Upper Extremity Assessment Upper Extremity Assessment: Defer to OT evaluation    Lower Extremity Assessment Lower Extremity Assessment: Generalized weakness    Cervical / Trunk Assessment Cervical / Trunk Assessment: Back  Surgery  Communication   Communication Communication: No apparent difficulties    Cognition Arousal: Alert Behavior During Therapy: WFL for tasks assessed/performed   PT - Cognitive impairments: No apparent impairments                         Following commands: Intact       Cueing Cueing Techniques: Verbal cues     General Comments      Exercises     Assessment/Plan    PT Assessment Patient needs continued PT services  PT Problem List Decreased activity tolerance;Decreased balance;Decreased mobility;Decreased knowledge of use of DME;Decreased skin integrity;Pain       PT Treatment Interventions DME instruction;Gait training;Stair training;Functional mobility training;Therapeutic activities;Therapeutic exercise;Patient/family education    PT Goals (Current goals can be found in the Care Plan section)  Acute Rehab PT Goals Patient Stated Goal: return home PT Goal Formulation: With patient Time For Goal Achievement: 09/21/24 Potential to Achieve Goals: Good    Frequency Min 4X/week     Co-evaluation PT/OT/SLP Co-Evaluation/Treatment: Yes Reason for Co-Treatment: Necessary to address cognition/behavior during functional activity;For patient/therapist safety;To address functional/ADL transfers PT goals addressed during session: Mobility/safety with mobility;Balance;Proper use of DME         AM-PAC PT 6 Clicks Mobility  Outcome Measure Help needed turning from your back to your side while in a flat bed without using bedrails?: A Lot Help needed moving from lying on your back to sitting on the side of a flat bed without using bedrails?: A Lot Help needed moving to and from a bed to a chair (including a wheelchair)?: A Little Help needed standing up from a chair using your arms (e.g., wheelchair or bedside chair)?: A Little Help needed to walk in hospital room?: A Little Help needed climbing 3-5 steps with a railing? : A Little 6 Click Score: 16     End of Session   Activity Tolerance: Patient tolerated treatment well Patient left: in chair;with call bell/phone within reach Nurse Communication: Mobility status PT Visit Diagnosis: Other abnormalities of gait and mobility (R26.89);Muscle weakness (generalized) (M62.81);Pain;Difficulty in walking, not elsewhere classified (R26.2) Pain - part of body:  (back)    Time: 9059-9042 PT Time Calculation (min) (ACUTE ONLY): 17 min   Charges:   PT Evaluation $PT Eval Moderate Complexity: 1 Mod   PT General Charges $$ ACUTE PT VISIT: 1 Visit         Hisae Decoursey, PT, GCS 09/14/24,10:51 AM

## 2024-09-14 NOTE — Anesthesia Postprocedure Evaluation (Signed)
 Anesthesia Post Note  Patient: Yojan Paskett  Procedure(s) Performed: OPEN LEFT T10-11 TRANSPEDICULAR DISCECTOMY AND FUSION (Left: Spine Thoracic) POSTERIOR THORACIC FUSION 1 LEVEL (Left: Spine Thoracic) APPLICATION OF INTRAOPERATIVE CT SCAN (Spine Thoracic)  Patient location during evaluation: PACU Anesthesia Type: General Level of consciousness: awake and alert Pain management: pain level controlled Vital Signs Assessment: post-procedure vital signs reviewed and stable Respiratory status: spontaneous breathing, nonlabored ventilation, respiratory function stable and patient connected to nasal cannula oxygen Cardiovascular status: blood pressure returned to baseline and stable Postop Assessment: no apparent nausea or vomiting Anesthetic complications: no   No notable events documented.   Last Vitals:  Vitals:   09/14/24 0435 09/14/24 0500  BP: 111/66 102/66  Pulse: 68 72  Resp: 16 14  Temp: 37 C   SpO2: 96% 96%    Last Pain:  Vitals:   09/14/24 0435  TempSrc: Oral  PainSc: 0-No pain                 Prentice Murphy

## 2024-09-15 ENCOUNTER — Ambulatory Visit: Admitting: Family Medicine

## 2024-09-15 LAB — GLUCOSE, CAPILLARY
Glucose-Capillary: 184 mg/dL — ABNORMAL HIGH (ref 70–99)
Glucose-Capillary: 141 mg/dL — ABNORMAL HIGH (ref 70–99)
Glucose-Capillary: 215 mg/dL — ABNORMAL HIGH (ref 70–99)
Glucose-Capillary: 89 mg/dL (ref 70–99)

## 2024-09-15 MED ORDER — CELECOXIB 200 MG PO CAPS
200.0000 mg | ORAL_CAPSULE | Freq: Two times a day (BID) | ORAL | Status: DC
  Administered 2024-09-15 – 2024-09-19 (×9): 200 mg via ORAL
  Filled 2024-09-15 (×9): qty 1

## 2024-09-15 NOTE — Plan of Care (Signed)
  Problem: Pain Managment: Goal: General experience of comfort will improve and/or be controlled Outcome: Progressing   Problem: Safety: Goal: Ability to remain free from injury will improve Outcome: Progressing   Problem: Clinical Measurements: Goal: Postoperative complications will be avoided or minimized Outcome: Progressing

## 2024-09-15 NOTE — Plan of Care (Signed)
   Problem: Education: Goal: Knowledge of General Education information will improve Description: Including pain rating scale, medication(s)/side effects and non-pharmacologic comfort measures Outcome: Progressing   Problem: Activity: Goal: Risk for activity intolerance will decrease Outcome: Progressing

## 2024-09-15 NOTE — Progress Notes (Signed)
 Physical Therapy Treatment Patient Details Name: Johnny Gonzales MRN: [id number] DOB: 1961/04/27 Today's Date: 09/15/2024   History of Present Illness Mr. Rosbel Buckner is here today with a chief complaint of  difficulty walking and numbness in both legs. He is s/p TLIF. No brace needed.    PT Comments  Pt received in recliner reporting 10/10 pain in back that hurts whether he is sitting, standing, or walking but was motivated to work with PT. Pt required 2+ min A for STS d/t pain levels but upon standing required CGA for the rest of the session. Pt ambulated 200 ft with RW, frequent standing rest breaks, and chair follow for safety. Pt limited in his ability to perform stairs d/t pain so he was returned to his recliner with all belongings within reach and instruction to call nursing for pain meds. Pt will continue to benefit from skilled PT services to address his pain and post-operative reduction in strength, balance, and functional mobility.    If plan is discharge home, recommend the following: A little help with walking and/or transfers;A little help with bathing/dressing/bathroom   Can travel by private vehicle        Equipment Recommendations  Rolling walker (2 wheels)    Recommendations for Other Services       Precautions / Restrictions Precautions Precautions: Fall;Back Recall of Precautions/Restrictions: Intact Restrictions Weight Bearing Restrictions Per Provider Order: No     Mobility  Bed Mobility               General bed mobility comments: NT - received in recliner    Transfers Overall transfer level: Needs assistance Equipment used: Rolling walker (2 wheels) Transfers: Sit to/from Stand Sit to Stand: Min assist, +2 physical assistance                Ambulation/Gait Ambulation/Gait assistance: Contact guard assist Gait Distance (Feet): 200 Feet Assistive device: Rolling walker (2 wheels) Gait Pattern/deviations: Step-through pattern, Decreased step  length - right, Decreased step length - left, Decreased stride length       General Gait Details: reporting 10/10 pain but motivated to walk; 257ft with chair follow and RW   Stairs             Wheelchair Mobility     Tilt Bed    Modified Rankin (Stroke Patients Only)       Balance Overall balance assessment: Needs assistance Sitting-balance support: Feet supported Sitting balance-Leahy Scale: Good     Standing balance support: Bilateral upper extremity supported, During functional activity, Reliant on assistive device for balance Standing balance-Leahy Scale: Good                              Communication Communication Communication: No apparent difficulties  Cognition Arousal: Alert Behavior During Therapy: WFL for tasks assessed/performed   PT - Cognitive impairments: Attention, Awareness                         Following commands: Intact      Cueing Cueing Techniques: Verbal cues  Exercises      General Comments        Pertinent Vitals/Pain Pain Assessment Pain Assessment: 0-10 Pain Score: 10-Worst pain ever Pain Location: back Pain Descriptors / Indicators: Discomfort, Sore Pain Intervention(s): Monitored during session, Patient requesting pain meds-RN notified, Limited activity within patient's tolerance    Home Living Family/patient expects to be discharged to:: Private  residence Living Arrangements: Non-relatives/Friends Available Help at Discharge: Available PRN/intermittently Type of Home: House Home Access: Stairs to enter Entrance Stairs-Rails: Right Entrance Stairs-Number of Steps: 2-3   Home Layout: One level Home Equipment: Cane - single Librarian, Academic (2 wheels)      Prior Function            PT Goals (current goals can now be found in the care plan section) Acute Rehab PT Goals Patient Stated Goal: return home PT Goal Formulation: With patient Time For Goal Achievement:  09/21/24 Potential to Achieve Goals: Good    Frequency    Min 4X/week      PT Plan      Co-evaluation              AM-PAC PT 6 Clicks Mobility   Outcome Measure  Help needed turning from your back to your side while in a flat bed without using bedrails?: A Lot Help needed moving from lying on your back to sitting on the side of a flat bed without using bedrails?: A Lot Help needed moving to and from a bed to a chair (including a wheelchair)?: A Little Help needed standing up from a chair using your arms (e.g., wheelchair or bedside chair)?: A Little Help needed to walk in hospital room?: A Little Help needed climbing 3-5 steps with a railing? : A Little 6 Click Score: 16    End of Session Equipment Utilized During Treatment: Gait belt Activity Tolerance: Patient limited by pain Patient left: in chair;with call bell/phone within reach;with chair alarm set Nurse Communication: Patient requests pain meds;Mobility status PT Visit Diagnosis: Other abnormalities of gait and mobility (R26.89);Muscle weakness (generalized) (M62.81);Pain;Difficulty in walking, not elsewhere classified (R26.2)     Time: 9067-9051 PT Time Calculation (min) (ACUTE ONLY): 16 min  Charges:                            Allena Bulls, SPT    Allena Bulls 09/15/2024, 10:14 AM

## 2024-09-15 NOTE — Progress Notes (Signed)
 Mobility Specialist - Progress Note     09/15/24 1600  Mobility  Activity Ambulated with assistance;Stood at bedside;Pivoted/transferred from bed to chair;Pivoted/transferred from chair to bed  Level of Assistance Minimal assist, patient does 75% or more  Assistive Device Front wheel walker  Distance Ambulated (ft) 9 ft  Range of Motion/Exercises Active  Activity Response Tolerated well  Mobility Referral Yes  Mobility visit 1 Mobility   Pt resting EOB upon entry on RA. Pt STS and ambulates to recliner during hygiene clean and bed change. Pt declined further mobility due to pain in back. Pt returned to bed and left with needs in reach. Bed alarm activated.   [name] Mobility Specialist 09/15/24, 4:14 PM

## 2024-09-15 NOTE — Progress Notes (Signed)
 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  His legs are feeling stronger.  Johnny Gonzales still has some altered sensation in his feet.  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  Johnny Gonzales is doing well.  - mobilize - pain control - DVT prophylaxis - PTOT   Reeves Daisy MD, [facility] Department of Neurosurgery

## 2024-09-16 ENCOUNTER — Other Ambulatory Visit: Payer: Self-pay

## 2024-09-16 LAB — GLUCOSE, CAPILLARY
Glucose-Capillary: 202 mg/dL — ABNORMAL HIGH (ref 70–99)
Glucose-Capillary: 209 mg/dL — ABNORMAL HIGH (ref 70–99)
Glucose-Capillary: 139 mg/dL — ABNORMAL HIGH (ref 70–99)
Glucose-Capillary: 139 mg/dL — ABNORMAL HIGH (ref 70–99)

## 2024-09-16 MED ORDER — METHOCARBAMOL 500 MG PO TABS
500.0000 mg | ORAL_TABLET | Freq: Four times a day (QID) | ORAL | 0 refills | Status: DC | PRN
  Filled 2024-09-16: qty 120, 30d supply, fill #0

## 2024-09-16 MED ORDER — OXYCODONE HCL 5 MG PO TABS
5.0000 mg | ORAL_TABLET | ORAL | 0 refills | Status: DC | PRN
  Filled 2024-09-16: qty 30, 5d supply, fill #0

## 2024-09-16 MED ORDER — CELECOXIB 200 MG PO CAPS
200.0000 mg | ORAL_CAPSULE | Freq: Two times a day (BID) | ORAL | 0 refills | Status: AC
  Filled 2024-09-16: qty 60, 30d supply, fill #0

## 2024-09-16 MED ORDER — POLYETHYLENE GLYCOL 3350 17 GM/SCOOP PO POWD
17.0000 g | Freq: Every day | ORAL | 0 refills | Status: DC | PRN
  Filled 2024-09-16: qty 238, 14d supply, fill #0

## 2024-09-16 MED ORDER — ORAL CARE MOUTH RINSE: 15.0000 mL | OROMUCOSAL | Status: DC | PRN

## 2024-09-16 NOTE — Progress Notes (Deleted)
 NO SHOW

## 2024-09-16 NOTE — Plan of Care (Signed)

## 2024-09-16 NOTE — Progress Notes (Signed)
 Physical Therapy Treatment Patient Details Name: Johnny Gonzales MRN: [id number] DOB: Nov 13, 1960 Today's Date: 09/16/2024   History of Present Illness [name] is here with a chief complaint of  difficulty walking and numbness in both legs. He is s/p TLIF. No brace needed.    PT Comments  Pt was lying in bed on arrival, was pleasant and motivated to participate during the session and put forth good effort throughout. Pt reported pain 3/10 throughout the session. Pt was able to mobilize to EOB with supervision. Pt was able to transfer to standing with CGA, but needed cueing for hand placement as well as sig time and effort. Upon standing, he reported feeling off/dizzy and BP was assessed at 127/84 in sitting; 123/88 standing -- nursing notified and no further instance throughout session. Pts SpO2 and HR were also assessed throughout and remained WNL. Pt was able to both ambulate and navigate stairs during today's session. He needed CGA for ambulation with RW (see details below) and navigated the stairs using railing on R side ascending and R side descending backwards, with CGA and with heavy lean on rail for support. Pt was educated on car transfer sequencing using RW stepping backwards, but decline to practice. Pt will benefit from continued PT services upon discharge to safely address deficits listed in patient problem list for decreased caregiver assistance and eventual return to PLOF.     If plan is discharge home, recommend the following: A little help with walking and/or transfers;A little help with bathing/dressing/bathroom   Can travel by private vehicle        Equipment Recommendations  Rolling walker (2 wheels)    Recommendations for Other Services       Precautions / Restrictions Precautions Precautions: Fall;Back Recall of Precautions/Restrictions: Intact Restrictions Weight Bearing Restrictions Per Provider Order: No     Mobility  Bed Mobility Overal bed mobility: Needs  Assistance Bed Mobility: Sidelying to Sit   Sidelying to sit: Supervision       General bed mobility comments: Pt was in sidelying on arrival -- needed supervision to mobilize to EOB    Transfers Overall transfer level: Needs assistance Equipment used: Rolling walker (2 wheels) Transfers: Sit to/from Stand Sit to Stand: Contact guard assist           General transfer comment: cues for hand placement, push through bed with UE    Ambulation/Gait Ambulation/Gait assistance: Contact guard assist Gait Distance (Feet): 100 Feet Assistive device: Rolling walker (2 wheels) Gait Pattern/deviations: Step-through pattern, Decreased step length - right, Decreased step length - left, Decreased stride length Gait velocity: mildly decreased   Pre-gait activities: marching General Gait Details: Pt was able to ambulate to 100 ft, only to preserve energy for stairs, used RW with chair follow, CGA   Stairs             Wheelchair Mobility     Tilt Bed    Modified Rankin (Stroke Patients Only)       Balance Overall balance assessment: Needs assistance Sitting-balance support: Feet supported Sitting balance-Leahy Scale: Good     Standing balance support: During functional activity, Reliant on assistive device for balance Standing balance-Leahy Scale: Good Standing balance comment: pts balance was good overall, no LOB or buckling                            Communication Communication Communication: No apparent difficulties  Cognition Arousal: Alert Behavior During Therapy: [facility]  for tasks assessed/performed   PT - Cognitive impairments: No apparent impairments                         Following commands: Intact      Cueing Cueing Techniques: Verbal cues, Visual cues, Gestural cues, Tactile cues  Exercises  Pt edu on stair training and car transfers     General Comments General comments (skin integrity, edema, etc.): pt reported feeling off  upon standing, checked BP to ensure he was stable prior to ambulation. BP sitting: 127/84. Standing: 123/88      Pertinent Vitals/Pain Pain Assessment Pain Assessment: 0-10 Pain Score: 3  Pain Location: back Pain Descriptors / Indicators: Discomfort, Sore    Home Living                          Prior Function            PT Goals (current goals can now be found in the care plan section) Acute Rehab PT Goals Patient Stated Goal: return home PT Goal Formulation: With patient Time For Goal Achievement: 09/21/24 Potential to Achieve Goals: Good Progress towards PT goals: Progressing toward goals    Frequency    Min 4X/week      PT Plan      Co-evaluation              AM-PAC PT 6 Clicks Mobility   Outcome Measure  Help needed turning from your back to your side while in a flat bed without using bedrails?: A Little Help needed moving from lying on your back to sitting on the side of a flat bed without using bedrails?: A Little Help needed moving to and from a bed to a chair (including a wheelchair)?: A Little Help needed standing up from a chair using your arms (e.g., wheelchair or bedside chair)?: A Little Help needed to walk in hospital room?: A Little Help needed climbing 3-5 steps with a railing? : A Little 6 Click Score: 18    End of Session Equipment Utilized During Treatment: Gait belt Activity Tolerance: Patient limited by fatigue Patient left: in chair;with call bell/phone within reach;with chair alarm set Nurse Communication: Mobility status PT Visit Diagnosis: Other abnormalities of gait and mobility (R26.89);Muscle weakness (generalized) (M62.81);Pain;Difficulty in walking, not elsewhere classified (R26.2) Pain - part of body:  (back)     Time: 8574-8551 PT Time Calculation (min) (ACUTE ONLY): 23 min  Charges:                            Johnny Gonzales, SPT 09/16/24, 4:34 PM

## 2024-09-16 NOTE — Plan of Care (Signed)
  Problem: Education: Goal: Knowledge of General Education information will improve Description: Including pain rating scale, medication(s)/side effects and non-pharmacologic comfort measures Outcome: Progressing   Problem: Activity: Goal: Risk for activity intolerance will decrease Outcome: Progressing   Problem: Pain Managment: Goal: General experience of comfort will improve and/or be controlled Outcome: Progressing

## 2024-09-16 NOTE — Progress Notes (Signed)
 Neurosurgery Progress Note  History: Susana Gripp is here for thoracic myelopathy  POD3: Doing well.   POD2: Had significant pain overnight POD 1: He is doing well.  His legs are feeling stronger.  He still has some altered sensation in his feet.  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  He is doing well.  - mobilize - pain control - DVT prophylaxis - PTOT   Reeves Daisy MD, [facility] Department of Neurosurgery

## 2024-09-16 NOTE — Discharge Summary (Deleted)
 Physician Discharge Summary  Patient ID: Johnny Gonzales MRN: [id number] DOB/AGE: [birth year] 63 y.o.  Admit date: 09/13/2024 Discharge date: 09/16/2024  Admission Diagnoses: Principal Problem:   Thoracic myelopathy Active Problems:   Thoracic stenosis   Myelomalacia [facility]) Discharge Diagnoses:  Principal Problem:   Thoracic myelopathy Active Problems:   Thoracic stenosis   Myelomalacia [facility])   Discharged Condition: good  Hospital Course: [name] presented with thoracic myelopathy.  He had elective surgery and was admitted for postoperative care.  He worked with physical and Occupational Therapy and was safe for drain removal and discharge by postoperative day 3.  Consults: none  Significant Diagnostic Studies: radiology: X-Ray: placement of implants confirmed  Treatments: surgery: T10-11 posterior fusion with left-sided transpedicular discectomy at T10-11  Discharge Exam: Blood pressure 121/69, pulse 79, temperature 98.1 F (36.7 C), resp. rate 17, height 6' (1.829 m), weight 108.9 kg, SpO2 99%. General appearance: alert and cooperative MAEW with 5/5 except L IP 4+  Disposition: Discharge disposition: 01-Home or Self Care       Discharge Instructions     Discharge patient   Complete by: As directed    Discharge disposition: 01-Home or Self Care   Discharge patient date: 09/16/2024   Incentive spirometry RT   Complete by: As directed       Allergies as of 09/16/2024   No Known Allergies      Medication List     STOP taking these medications    BC HEADACHE POWDER PO   Breztri  Aerosphere 160-9-4.8 MCG/ACT Aero inhaler Generic drug: budesonide -glycopyrrolate -formoterol   nicotine  polacrilex 4 MG gum Commonly known as: Nicorette    rosuvastatin  5 MG tablet Commonly known as: Crestor    sennosides-docusate sodium  8.6-50 MG tablet Commonly known as: SENOKOT-S       TAKE these medications    albuterol  108 (90 Base) MCG/ACT inhaler Commonly known  as: VENTOLIN  HFA Inhale 2 puffs into the lungs every 6 (six) hours as needed for wheezing or shortness of breath.   celecoxib  200 MG capsule Commonly known as: CELEBREX  Take 1 capsule (200 mg total) by mouth 2 (two) times daily.   DULoxetine  HCl 40 MG Cpep Take 1 capsule (40 mg total) by mouth daily. What changed: when to take this   metFORMIN  500 MG tablet Commonly known as: GLUCOPHAGE  Take 1 tablet (500 mg total) by mouth 2 (two) times daily with a meal. What changed: when to take this   methocarbamol  500 MG tablet Commonly known as: ROBAXIN  Take 1 tablet (500 mg total) by mouth every 6 (six) hours as needed for muscle spasms. What changed:  when to take this reasons to take this   oxyCODONE  5 MG immediate release tablet Commonly known as: Oxy IR/ROXICODONE  Take 1 tablet (5 mg total) by mouth every 4 (four) hours as needed for moderate pain (pain score 4-6).   polyethylene glycol 17 g packet Commonly known as: MIRALAX  / GLYCOLAX  Take 17 g by mouth daily as needed for mild constipation.         SignedBETHA REEVES DAISY 09/16/2024, 10:05 AM   Please note that he will has evidence supporting a diagnosis of class I obesity.  Additionally, he is diabetic and on treatment for diabetes for which we utilized a sliding scale of insulin  during his hospitalization.

## 2024-09-17 ENCOUNTER — Inpatient Hospital Stay

## 2024-09-17 LAB — BASIC METABOLIC PANEL WITH GFR
Anion gap: 16 — ABNORMAL HIGH (ref 5–15)
BUN: 13 mg/dL (ref 8–23)
CO2: 20 mmol/L — ABNORMAL LOW (ref 22–32)
Calcium: 9.5 mg/dL (ref 8.9–10.3)
Chloride: 101 mmol/L (ref 98–111)
Creatinine, Ser: 0.77 mg/dL (ref 0.61–1.24)
GFR, Estimated: >60 mL/min (ref >60)
Glucose, Bld: 113 mg/dL — ABNORMAL HIGH (ref 70–99)
Potassium: 4.3 mmol/L (ref 3.5–5.1)
Sodium: 137 mmol/L (ref 135–145)

## 2024-09-17 LAB — CBC WITH DIFFERENTIAL/PLATELET
Abs Immature Granulocytes: 0.06 K/uL (ref 0.00–0.07)
Basophils Absolute: 0.1 K/uL (ref 0.0–0.1)
Basophils Relative: 1 %
Eosinophils Absolute: 0.1 K/uL (ref 0.0–0.5)
Eosinophils Relative: 2 %
HCT: 39.9 % (ref 39.0–52.0)
Hemoglobin: 13.4 g/dL (ref 13.0–17.0)
Immature Granulocytes: 1 %
Lymphocytes Relative: 26 %
Lymphs Abs: 2.4 K/uL (ref 0.7–4.0)
MCH: 26.6 pg (ref 26.0–34.0)
MCHC: 33.6 g/dL (ref 30.0–36.0)
MCV: 79.3 fL — ABNORMAL LOW (ref 80.0–100.0)
Monocytes Absolute: 0.7 K/uL (ref 0.1–1.0)
Monocytes Relative: 8 %
Neutro Abs: 5.8 K/uL (ref 1.7–7.7)
Neutrophils Relative %: 62 %
Platelets: 224 K/uL (ref 150–400)
RBC: 5.03 MIL/uL (ref 4.22–5.81)
RDW: 13.2 % (ref 11.5–15.5)
WBC: 9.2 K/uL (ref 4.0–10.5)
nRBC: 0 % (ref 0.0–0.2)

## 2024-09-17 LAB — GLUCOSE, CAPILLARY
Glucose-Capillary: 144 mg/dL — ABNORMAL HIGH (ref 70–99)
Glucose-Capillary: 130 mg/dL — ABNORMAL HIGH (ref 70–99)
Glucose-Capillary: 140 mg/dL — ABNORMAL HIGH (ref 70–99)
Glucose-Capillary: 141 mg/dL — ABNORMAL HIGH (ref 70–99)

## 2024-09-17 MED ORDER — SMOG ENEMA
960.0000 mL | Freq: Once | RECTAL | Status: DC
  Filled 2024-09-17: qty 960

## 2024-09-17 NOTE — Plan of Care (Signed)

## 2024-09-17 NOTE — Progress Notes (Addendum)
 Neurosurgery Progress Note  History: Johnny Gonzales is here for thoracic myelopathy  POD4: Having significant abdominal pain. Has not had BM POD3: Doing well.   POD2: Had significant pain overnight POD 1: He is doing well.  His legs are feeling stronger.  He still has some altered sensation in his feet.  Physical Exam: Vitals:   09/17/24 0614 09/17/24 0729  BP: 129/85 123/71  Pulse: 78 69  Resp:  17  Temp:  97.6 F (36.4 C)  SpO2:  95%    AA Ox3 CNI  Strength:5/5 throughout bilateral lower extremities with exception of left hip flexion which is 4+ out of 5 Sensation is symmetric bilaterally  Incision shows superficial seroma but no drainage    Assessment/Plan:  Johnny Gonzales is doing well after transpedicular discectomy for thoracic stenosis and myelopathy.  He is doing well.  - mobilize - pain control - DVT prophylaxis - PTOT - Needs BM - will give mag citrate.  Enema if no BM - having some SOB - will get CXR today and labs   Reeves Daisy MD, [facility] Department of Neurosurgery

## 2024-09-17 NOTE — Progress Notes (Signed)
 Physical Therapy Treatment Patient Details Name: Johnny Gonzales MRN: [id number] DOB: Jun 20, 1961 Today's Date: 09/17/2024   History of Present Illness [name] is here today with a chief complaint of  difficulty walking and numbness in both legs. He is s/p TLIF. No brace needed.    PT Comments  Pt c/o back pain I feel bad today rates 7-8/10 with rest and mobility.  He uses bed features in and out of bed and requests elevated bed to stand.  He is able to stand with cues for hand placements and walk about 50' before fatigue and pain limit further mobility.  He stops to lean on nursing station counter and RN brings chair as he feels he is unable to make it back to his room.  Plan was for stair training but he is unable to continue at this time.  He asks for ride back to his room where he transfers back to bed with RW and cues.  He lays back down on bed with min a for LE's for comfort but remains uncomfortable.  Asked pt if he wants to try sitting in chair and he agrees.  CGA fro transfer back to recliner where he reports inc comfort.    Will discuss with MD regarding limited mobility today.  Pt with normal HR and O2 after mobility but does sound a bit congested and discussed with RN.   If plan is discharge home, recommend the following: A little help with walking and/or transfers;A little help with bathing/dressing/bathroom;Assist for transportation;Help with stairs or ramp for entrance;Assistance with cooking/housework   Can travel by Training And Development Officer (2 wheels);BSC/3in1    Recommendations for Other Services       Precautions / Restrictions Precautions Precautions: Fall;Back Recall of Precautions/Restrictions: Intact Restrictions Weight Bearing Restrictions Per Provider Order: No     Mobility  Bed Mobility Overal bed mobility: Needs Assistance Bed Mobility: Supine to Sit   Sidelying to sit: HOB elevated, Used rails        General bed mobility comments: heavy use of bed features in and out of bed.  stated he will sleep in recliner at home Patient Response: Cooperative  Transfers Overall transfer level: Needs assistance Equipment used: Rolling walker (2 wheels) Transfers: Sit to/from Stand Sit to Stand: Contact guard assist, From elevated surface           General transfer comment: cues for hand placements as he continues to pull up on walker    Ambulation/Gait Ambulation/Gait assistance: Contact guard assist Gait Distance (Feet): 50 Feet Assistive device: Rolling walker (2 wheels) Gait Pattern/deviations: Step-through pattern, Decreased step length - right, Decreased step length - left, Decreased stride length Gait velocity: decreased     General Gait Details: heavy lean on RW for support with cues to fix hand placements.   Stairs             Wheelchair Mobility     Tilt Bed Tilt Bed Patient Response: Cooperative  Modified Rankin (Stroke Patients Only)       Balance Overall balance assessment: Needs assistance Sitting-balance support: Feet supported Sitting balance-Leahy Scale: Good     Standing balance support: During functional activity, Reliant on assistive device for balance Standing balance-Leahy Scale: Fair Standing balance comment: no LOB or buckling but often reaching for counter for supports  Communication Communication Communication: No apparent difficulties  Cognition Arousal: Alert Behavior During Therapy: WFL for tasks assessed/performed   PT - Cognitive impairments: No apparent impairments                         Following commands: Intact      Cueing Cueing Techniques: Verbal cues, Visual cues, Gestural cues, Tactile cues  Exercises      General Comments        Pertinent Vitals/Pain Pain Assessment Pain Assessment: 0-10 Pain Score: 8  Pain Location: back Pain Descriptors / Indicators:  Discomfort, Sore Pain Intervention(s): Limited activity within patient's tolerance, Monitored during session, Premedicated before session, Repositioned    Home Living                          Prior Function            PT Goals (current goals can now be found in the care plan section) Progress towards PT goals: Progressing toward goals    Frequency    Min 4X/week      PT Plan      Co-evaluation              AM-PAC PT 6 Clicks Mobility   Outcome Measure  Help needed turning from your back to your side while in a flat bed without using bedrails?: A Little Help needed moving from lying on your back to sitting on the side of a flat bed without using bedrails?: A Little Help needed moving to and from a bed to a chair (including a wheelchair)?: A Little Help needed standing up from a chair using your arms (e.g., wheelchair or bedside chair)?: A Little Help needed to walk in hospital room?: A Little Help needed climbing 3-5 steps with a railing? : A Little 6 Click Score: 18    End of Session Equipment Utilized During Treatment: Gait belt Activity Tolerance: Patient limited by fatigue Patient left: in chair;with call bell/phone within reach;with chair alarm set Nurse Communication: Mobility status PT Visit Diagnosis: Other abnormalities of gait and mobility (R26.89);Muscle weakness (generalized) (M62.81);Pain;Difficulty in walking, not elsewhere classified (R26.2) Pain - part of body:  (back)     Time: 0953-1010 PT Time Calculation (min) (ACUTE ONLY): 17 min  Charges:    $Gait Training: 8-22 mins PT General Charges $$ ACUTE PT VISIT: 1 Visit                   Lauraine Gills, PTA 09/17/24, 10:27 AM

## 2024-09-18 ENCOUNTER — Ambulatory Visit: Attending: Cardiovascular Disease | Admitting: Cardiovascular Disease

## 2024-09-18 DIAGNOSIS — E782 Mixed hyperlipidemia: Secondary | ICD-10-CM

## 2024-09-18 DIAGNOSIS — F17200 Nicotine dependence, unspecified, uncomplicated: Secondary | ICD-10-CM

## 2024-09-18 DIAGNOSIS — M4804 Spinal stenosis, thoracic region: Secondary | ICD-10-CM

## 2024-09-18 DIAGNOSIS — R0602 Shortness of breath: Secondary | ICD-10-CM

## 2024-09-18 DIAGNOSIS — E119 Type 2 diabetes mellitus without complications: Secondary | ICD-10-CM

## 2024-09-18 DIAGNOSIS — G9589 Other specified diseases of spinal cord: Secondary | ICD-10-CM

## 2024-09-18 LAB — CBC WITH DIFFERENTIAL/PLATELET
Abs Immature Granulocytes: 0.08 K/uL — ABNORMAL HIGH (ref 0.00–0.07)
Basophils Absolute: 0.1 K/uL (ref 0.0–0.1)
Basophils Relative: 1 %
Eosinophils Absolute: 0.3 K/uL (ref 0.0–0.5)
Eosinophils Relative: 3 %
HCT: 38.1 % — ABNORMAL LOW (ref 39.0–52.0)
Hemoglobin: 12.9 g/dL — ABNORMAL LOW (ref 13.0–17.0)
Immature Granulocytes: 1 %
Lymphocytes Relative: 31 %
Lymphs Abs: 3.2 K/uL (ref 0.7–4.0)
MCH: 26.9 pg (ref 26.0–34.0)
MCHC: 33.9 g/dL (ref 30.0–36.0)
MCV: 79.4 fL — ABNORMAL LOW (ref 80.0–100.0)
Monocytes Absolute: 0.8 K/uL (ref 0.1–1.0)
Monocytes Relative: 8 %
Neutro Abs: 6.1 K/uL (ref 1.7–7.7)
Neutrophils Relative %: 56 %
Platelets: 300 K/uL (ref 150–400)
RBC: 4.8 MIL/uL (ref 4.22–5.81)
RDW: 13.2 % (ref 11.5–15.5)
WBC: 10.4 K/uL (ref 4.0–10.5)
nRBC: 0 % (ref 0.0–0.2)

## 2024-09-18 LAB — COMPREHENSIVE METABOLIC PANEL WITH GFR
ALT: 13 U/L (ref 0–44)
AST: 16 U/L (ref 15–41)
Albumin: 3.7 g/dL (ref 3.5–5.0)
Alkaline Phosphatase: 74 U/L (ref 38–126)
Anion gap: 15 (ref 5–15)
BUN: 18 mg/dL (ref 8–23)
CO2: 20 mmol/L — ABNORMAL LOW (ref 22–32)
Calcium: 9.2 mg/dL (ref 8.9–10.3)
Chloride: 101 mmol/L (ref 98–111)
Creatinine, Ser: 0.78 mg/dL (ref 0.61–1.24)
GFR, Estimated: >60 mL/min (ref >60)
Glucose, Bld: 110 mg/dL — ABNORMAL HIGH (ref 70–99)
Potassium: 4 mmol/L (ref 3.5–5.1)
Sodium: 136 mmol/L (ref 135–145)
Total Bilirubin: 0.3 mg/dL (ref 0.0–1.2)
Total Protein: 7 g/dL (ref 6.5–8.1)

## 2024-09-18 LAB — BLOOD GAS, VENOUS
Acid-Base Excess: 0.4 mmol/L (ref 0.0–2.0)
Bicarbonate: 22.6 mmol/L (ref 20.0–28.0)
O2 Saturation: 87.8 %
Patient temperature: 37
pCO2, Ven: 29 mmHg — ABNORMAL LOW (ref 44–60)
pH, Ven: 7.5 — ABNORMAL HIGH (ref 7.25–7.43)
pO2, Ven: 52 mmHg — ABNORMAL HIGH (ref 32–45)

## 2024-09-18 LAB — GLUCOSE, CAPILLARY
Glucose-Capillary: 131 mg/dL — ABNORMAL HIGH (ref 70–99)
Glucose-Capillary: 102 mg/dL — ABNORMAL HIGH (ref 70–99)
Glucose-Capillary: 103 mg/dL — ABNORMAL HIGH (ref 70–99)
Glucose-Capillary: 148 mg/dL — ABNORMAL HIGH (ref 70–99)
Glucose-Capillary: 140 mg/dL — ABNORMAL HIGH (ref 70–99)

## 2024-09-18 MED ORDER — SODIUM CHLORIDE 0.9 % IV SOLN: INTRAVENOUS | Status: AC

## 2024-09-18 MED ORDER — IPRATROPIUM-ALBUTEROL 0.5-2.5 (3) MG/3ML IN SOLN
3.0000 mL | Freq: Four times a day (QID) | RESPIRATORY_TRACT | Status: DC
  Administered 2024-09-18: 3 mL via RESPIRATORY_TRACT
  Filled 2024-09-18: qty 3

## 2024-09-18 MED ORDER — PREDNISONE 20 MG PO TABS
40.0000 mg | ORAL_TABLET | Freq: Every day | ORAL | Status: DC
  Administered 2024-09-18: 40 mg via ORAL
  Filled 2024-09-18: qty 2

## 2024-09-18 MED ORDER — BUDESONIDE 0.5 MG/2ML IN SUSP
0.5000 mg | Freq: Two times a day (BID) | RESPIRATORY_TRACT | Status: DC
  Administered 2024-09-18 – 2024-09-19 (×2): 0.5 mg via RESPIRATORY_TRACT
  Filled 2024-09-18 (×2): qty 2

## 2024-09-18 MED ORDER — IPRATROPIUM-ALBUTEROL 0.5-2.5 (3) MG/3ML IN SOLN
3.0000 mL | Freq: Two times a day (BID) | RESPIRATORY_TRACT | Status: DC
  Administered 2024-09-19: 3 mL via RESPIRATORY_TRACT
  Filled 2024-09-18: qty 3

## 2024-09-18 NOTE — Plan of Care (Signed)
   Problem: Education: Goal: Knowledge of General Education information will improve Description Including pain rating scale, medication(s)/side effects and non-pharmacologic comfort measures Outcome: Progressing   Problem: Health Behavior/Discharge Planning: Goal: Ability to manage health-related needs will improve Outcome: Progressing

## 2024-09-18 NOTE — Progress Notes (Signed)
 Neurosurgery Progress Note  History: Johnny Gonzales is here for thoracic myelopathy  POD5: Pt still complaining of swelling in RLE. States his SOB and abdominal pain have improved.  POD4: Having significant abdominal pain. Has not had BM POD3: Doing well.   POD2: Had significant pain overnight POD 1: He is doing well.  His legs are feeling stronger.  He still has some altered sensation in his feet.  Physical Exam: Vitals:   09/17/24 2000 09/18/24 0444  BP: 116/69 101/64  Pulse: 88 77  Resp: 18 18  Temp: 98.1 F (36.7 C) 98.3 F (36.8 C)  SpO2: 98% 99%    AA Ox3 CNI  Strength:5/5 throughout bilateral lower extremities with exception of left hip flexion which is 4+ out of 5 Sensation is symmetric bilaterally  Incision shows superficial seroma but no drainage  CXR 12/7 IMPRESSION: 1. No acute cardiopulmonary process. 2. Faint interstitial accentuation, potentially smoking-related. 3. Thoracic spondylosis.   Electronically signed by: Ryan Salvage MD 09/17/2024 03:23 PM EST RP Workstation: [id number]      Latest Ref Rng & Units 09/17/2024   12:33 PM 12/28/2020    2:22 PM 05/01/2015    4:13 AM  CBC  WBC 4.0 - 10.5 K/uL 9.2  9.4  10.1   Hemoglobin 13.0 - 17.0 g/dL 86.5  85.7  88.6   Hematocrit 39.0 - 52.0 % 39.9  43.2  34.1   Platelets 150 - 400 K/uL 224  246  287       Latest Ref Rng & Units 09/17/2024   12:33 PM 09/14/2024    3:29 AM 12/28/2020    2:22 PM  BMP  Glucose 70 - 99 mg/dL 886   891   BUN 8 - 23 mg/dL 13   16   Creatinine 9.38 - 1.24 mg/dL 9.22  8.96  9.10   Sodium 135 - 145 mmol/L 137   136   Potassium 3.5 - 5.1 mmol/L 4.3   3.8   Chloride 98 - 111 mmol/L 101   106   CO2 22 - 32 mmol/L 20   24   Calcium  8.9 - 10.3 mg/dL 9.5   9.1     Assessment/Plan:  Johnny Gonzales is doing well after transpedicular discectomy for thoracic stenosis and myelopathy.  He is doing well.  - mobilize - pain control - DVT prophylaxis - PTOT - Pt had BM On 12/7 -  having some SOB - chest XR and labs reassuring   Edsel Goods PA-C Department of Neurosurgery

## 2024-09-18 NOTE — Consult Note (Signed)
 Initial Consultation Note   Patient: Johnny Gonzales FMW:[id number] DOB: 1961-02-16 PCP: Kotturi, Vinay K, MD DOA: 09/13/2024 DOS: the patient was seen and examined on 09/18/2024 Primary service: Clois Fret, MD  Referring physician: Dr. Clois Reason for consult: Generalized weakness  Assessment/Plan: DOE: Pt with shortness of breath with exertion and notable increased during interview. He has hx of COPD and has not been using his inhalers. Lung sounds are diminished with mild wheeze. He appears to be having COPD exacerbation. Discussed with surgery team and okay from their perspective to start on steroid. Will start on duonebs and pulmicort  along with prednisone  40 mg every day. Considered other causes including ACS vs PE vs PNA but unlikely given no chest pain, no tachycardia and and no fevers or chills.  -Treat as above.  -Monitor tomorrow.   Orthostatic hypotension: likely in setting of decreased intake as reported by the patient. On IVF. Will continue. Repeat orthostatics tomorrow. If continues to be refractory would consider other causes for this including discussing with surgery regarding post surgical orthostatic hypotension which is less likely currently.   HLD: continue home crestor  5 mg every day.   Tobacco use disorder: counseled on complete cessation.   T2DM: holding home metformin  and on SSI.   DDD: continue multimodal pain regimen including duloxetine . Acute pain regimen ordered by NSG.   TRH will continue to follow the patient.  HPI: Johnny Gonzales is a 63 y.o. male with past medical history of HLD, T2DM, tobacco use disorder, COPD and DDD with severe multilevel stenosis who was admitted for thoracic myelopathy to undergo surgical intervention. TRH is asked to evaluate on POD 5 for generalized weakness. Pt states he just feels winded. Denies significant coughing, URI symptoms, fevers or chills. States he has been having numbness in bilateral legs that predates his  surgery. It is still present and unchanged.   Review of Systems: As mentioned in the history of present illness. All other systems reviewed and are negative. Past Medical History:  Diagnosis Date   Back pain    Cigarette smoker    Dyspnea    History of cocaine use    Hyperlipidemia    Lumbar spinal stenosis    Lumbar spondylosis    Pre-diabetes    Past Surgical History:  Procedure Laterality Date   APPLICATION OF INTRAOPERATIVE CT SCAN N/A 09/13/2024   Procedure: APPLICATION OF INTRAOPERATIVE CT SCAN;  Surgeon: Clois Fret, MD;  Location: [facility] ORS;  Service: Neurosurgery;  Laterality: N/A;   HAND SURGERY     INCISION AND DRAINAGE ABSCESS N/A 04/30/2015   Procedure: INCISION AND DRAINAGE ABSCESS/PERINEAL ABSCESS;  Surgeon: Rosina Riis, MD;  Location: [facility] OR[facility];  Service: Urology;  Laterality: N/A;   THORACIC DISCECTOMY Left 09/13/2024   Procedure: OPEN LEFT T10-11 TRANSPEDICULAR DISCECTOMY AND FUSION;  Surgeon: Clois Fret, MD;  Location: [facility] OR[facility];  Service: Neurosurgery;  Laterality: Left;  OPEN LEFT T10-11 TRANSPEDICULAR DISCECTOMY AND FUSION   Social History:  reports that he has been smoking cigarettes. He has never used smokeless tobacco. He reports that he does not currently use alcohol after a past usage of about 2.0 standard drinks of alcohol per week. He reports that he does not currently use drugs after having used the following drugs: Cocaine.  No Known Allergies  Family History  Problem Relation Age of Onset   Stroke Father    Heart attack Brother    Heart attack Mother    Bladder Cancer Neg Hx    Prostate cancer Neg  Hx    Kidney cancer Neg Hx     Prior to Admission medications   Medication Sig Start Date End Date Taking? Authorizing Provider  albuterol  (VENTOLIN  HFA) 108 (90 Base) MCG/ACT inhaler Inhale 2 puffs into the lungs every 6 (six) hours as needed for wheezing or shortness of breath. 08/18/24  Yes Kotturi, Vinay K, MD  DULoxetine  40 MG CPEP  Take 1 capsule (40 mg total) by mouth daily. Patient taking differently: Take 40 mg by mouth every morning. 08/03/24  Yes Kotturi, Vinay K, MD  metFORMIN  (GLUCOPHAGE ) 500 MG tablet Take 1 tablet (500 mg total) by mouth 2 (two) times daily with a meal. Patient taking differently: Take 500 mg by mouth daily. 07/20/24  Yes Kotturi, Vinay K, MD  Aspirin -Salicylamide-Caffeine (BC HEADACHE POWDER PO) Take 1 packet by mouth daily as needed (pain and stiffness.). Patient not taking: Reported on 09/13/2024    [provider]  budesonide -glycopyrrolate -formoterol (BREZTRI  AEROSPHERE) 160-9-4.8 MCG/ACT AERO inhaler Inhale 2 puffs into the lungs 2 (two) times daily. Patient not taking: Reported on 09/13/2024 09/12/24   Kotturi, Vinay K, MD  celecoxib  (CELEBREX ) 200 MG capsule Take 1 capsule (200 mg total) by mouth 2 (two) times daily. 09/16/24   Clois Fret, MD  methocarbamol  (ROBAXIN ) 500 MG tablet Take 1 tablet (500 mg total) by mouth 3 (three) times daily. Patient not taking: Reported on 09/13/2024 07/20/24   Kotturi, Vinay K, MD  methocarbamol  (ROBAXIN ) 500 MG tablet Take 1 tablet (500 mg total) by mouth every 6 (six) hours as needed for muscle spasms. 09/16/24   Clois Fret, MD  nicotine  polacrilex (NICORETTE ) 4 MG gum Take 1 each (4 mg total) by mouth as needed for smoking cessation. Patient not taking: Reported on 09/13/2024 08/18/24   Kotturi, Vinay K, MD  oxyCODONE  (OXY IR/ROXICODONE ) 5 MG immediate release tablet Take 1 tablet (5 mg total) by mouth every 4 (four) hours as needed for moderate pain (pain score 4-6). 09/16/24   Clois Fret, MD  polyethylene glycol powder (GLYCOLAX /MIRALAX ) 17 GM/SCOOP powder Take 17 g by mouth daily as needed for mild constipation. Dissolve 1 capful (17g) in 4-8 ounces of liquid and take by mouth daily. 09/16/24   Clois Fret, MD  rosuvastatin  (CRESTOR ) 5 MG tablet Take 1 tablet (5 mg total) by mouth daily. Patient not taking: Reported on  09/13/2024 07/21/24   Kotturi, Vinay K, MD  sennosides-docusate sodium  (SENOKOT-S) 8.6-50 MG tablet Take 1 tablet by mouth daily. Patient not taking: Reported on 09/13/2024 08/18/24   Sol Mackey POUR, MD    Physical Exam: Vitals:   09/17/24 1659 09/17/24 2000 09/18/24 0444 09/18/24 0801  BP: 110/76 116/69 101/64 122/63  Pulse: 98 88 77 73  Resp: 17 18 18 16   Temp: (!) 97.5 F (36.4 C) 98.1 F (36.7 C) 98.3 F (36.8 C) 98.1 F (36.7 C)  TempSrc:    Oral  SpO2: 97% 98% 99% 98%  Weight:      Height:       Gen: NAD HENT: NCAT CV: RRR, good pulses Resp: decreased breath sounds, wheezing present Abd: No TTP, normal bowel sounds MSK: no asymmetry, good muscle bulk Neuro: alert and oriented x4. Sensation altered but intact and symmetrical in bilateral lower extremities.    Data Reviewed:   Results are pending, will review when available.    Family Communication: Updated at bedside Primary team communication:  Thank you very much for involving us  in the care of your patient. We will continue to follow  the patient.   Author: Morene Bathe, MD 09/18/2024 2:17 PM  For on call review [email or web address].

## 2024-09-18 NOTE — Progress Notes (Signed)
 Occupational Therapy Treatment Patient Details Name: Johnny Gonzales MRN: [id number] DOB: September 23, 1961 Today's Date: 09/18/2024   History of present illness [name] is here today with a chief complaint of  difficulty walking and numbness in both legs. He is s/p TLIF. No brace needed.   OT comments  Upon entering the room, pt supine in bed and agreeable to OT intervention with encouragement but pt does report not feeling well today. Pt needing min A for supine >sit and mod A to stand from EOB. Pt having difficulty standing for several minutes to take standing BP secondary to increased dizziness with sitting and standing tasks. Pt does appear to have increased HR in 120's in standing and rapid shallow breathing.  Pt declines transfers or attempts to ambulate and returns to supine after utilizing urinal independently while seated. Pt performing supine >sit with min A. Orthostatics obtained this session and will be placed below. Pt then reports that B LEs have been numb and OT notified team of pt's report. Call bell and all needed items within reach.   Supine: 115/68 (82) Sitting: 118/83(90) Standing: 109/84(94)      If plan is discharge home, recommend the following:  A little help with walking and/or transfers;A little help with bathing/dressing/bathroom;Assistance with cooking/housework;Assist for transportation;Help with stairs or ramp for entrance   Equipment Recommendations  BSC/3in1;Other (comment) (2WW)       Precautions / Restrictions Precautions Precautions: Fall;Back Recall of Precautions/Restrictions: Intact       Mobility Bed Mobility   Bed Mobility: Supine to Sit, Sit to Supine     Supine to sit: Min assist Sit to supine: Min assist        Transfers Overall transfer level: Needs assistance Equipment used: Rolling walker (2 wheels) Transfers: Sit to/from Stand Sit to Stand: Mod assist                 Balance Overall balance assessment: Needs  assistance Sitting-balance support: Feet supported Sitting balance-Leahy Scale: Good     Standing balance support: During functional activity, Reliant on assistive device for balance Standing balance-Leahy Scale: Fair                             ADL either performed or assessed with clinical judgement    Extremity/Trunk Assessment Upper Extremity Assessment Upper Extremity Assessment: Overall WFL for tasks assessed   Lower Extremity Assessment Lower Extremity Assessment: Overall WFL for tasks assessed   Cervical / Trunk Assessment Cervical / Trunk Assessment: Back Surgery    Vision Patient Visual Report: No change from baseline           Communication Communication Communication: No apparent difficulties   Cognition Arousal: Alert Behavior During Therapy: WFL for tasks assessed/performed Cognition: No apparent impairments                               Following commands: Intact        Cueing   Cueing Techniques: Verbal cues, Visual cues, Gestural cues, Tactile cues             Pertinent Vitals/ Pain       Pain Assessment Pain Assessment: Faces Faces Pain Scale: Hurts even more Pain Location: back Pain Descriptors / Indicators: Discomfort, Sore Pain Intervention(s): Limited activity within patient's tolerance, Monitored during session, Repositioned  Home Living Family/patient expects to be discharged to:: Private residence Living Arrangements:  Non-relatives/Friends Available Help at Discharge: Available PRN/intermittently Type of Home: House Home Access: Stairs to enter                                    Frequency  Min 2X/week        Progress Toward Goals  OT Goals(current goals can now be found in the care plan section)  Progress towards OT goals: Progressing toward goals         Co-evaluation    PT/OT/SLP Co-Evaluation/Treatment: Yes Reason for Co-Treatment: Necessary to address cognition/behavior  during functional activity;For patient/therapist safety;To address functional/ADL transfers PT goals addressed during session: Mobility/safety with mobility;Balance;Proper use of DME OT goals addressed during session: ADL's and self-care      AM-PAC OT 6 Clicks Daily Activity     Outcome Measure   Help from another person eating meals?: None Help from another person taking care of personal grooming?: A Little Help from another person toileting, which includes using toliet, bedpan, or urinal?: A Little Help from another person bathing (including washing, rinsing, drying)?: A Little Help from another person to put on and taking off regular upper body clothing?: A Little Help from another person to put on and taking off regular lower body clothing?: A Little 6 Click Score: 19    End of Session Equipment Utilized During Treatment: Rolling walker (2 wheels)  OT Visit Diagnosis: Unsteadiness on feet (R26.81);Muscle weakness (generalized) (M62.81)   Activity Tolerance Patient tolerated treatment well   Patient Left in bed;with call bell/phone within reach;with bed alarm set   Nurse Communication Mobility status        Time: 8842-8779 OT Time Calculation (min): 23 min  Charges: OT General Charges $OT Visit: 1 Visit OT Treatments $Therapeutic Activity: 23-37 mins  Izetta Claude, MS, OTR/L , CBIS ascom [phone]  09/18/24, 1:41 PM

## 2024-09-18 NOTE — Progress Notes (Signed)
 Physical Therapy Treatment Patient Details Name: Johnny Gonzales MRN: [id number] DOB: 03-15-1961 Today's Date: 09/18/2024   History of Present Illness Johnny Gonzales is here today with a chief complaint of  difficulty walking and numbness in both legs. He is s/p TLIF. No brace needed.    PT Comments  Reports improved pain at rest today 5/10 but does increase after mobilty to 9/10 despite being premedicated for session.  He uses bed features for bed mobility but no outside assist. Stands to RW with cga x 1 and is able to walk 30' before being limited by pain and dizziness.  BP sitting 125/86 P 79.  Attempted orthostatic BP's but he is unable to remain standing for full reading.  BP upon sitting 87/30 P88 but unsure if this is accurate due to transition to sitting during reading.  He does not feel he can do more at this time but does remain up in recliner.  Discussed discharge plan with pt. He is not progressing towards goals as expected and does voice he will struggle if DC home.  Introduced option of rehab I will think about it  Will update recommendations to reflect.  Discussed with team.   If plan is discharge home, recommend the following: A little help with walking and/or transfers;A little help with bathing/dressing/bathroom;Assist for transportation;Help with stairs or ramp for entrance;Assistance with cooking/housework   Can travel by Training And Development Officer (2 wheels);BSC/3in1    Recommendations for Other Services       Precautions / Restrictions Precautions Precautions: Fall;Back Recall of Precautions/Restrictions: Intact Restrictions Weight Bearing Restrictions Per Provider Order: No     Mobility  Bed Mobility Overal bed mobility: Modified Independent Bed Mobility: Supine to Sit   Sidelying to sit: HOB elevated, Used rails         Patient Response: Cooperative  Transfers Overall transfer level: Needs assistance Equipment  used: Rolling walker (2 wheels) Transfers: Sit to/from Stand Sit to Stand: Contact guard assist, From elevated surface                Ambulation/Gait Ambulation/Gait assistance: Contact guard assist Gait Distance (Feet): 30 Feet Assistive device: Rolling walker (2 wheels)   Gait velocity: decreased     General Gait Details: heavy lean on RW for support with cues to fix hand placements. limited by dizziness then pain.   Stairs             Wheelchair Mobility     Tilt Bed Tilt Bed Patient Response: Cooperative  Modified Rankin (Stroke Patients Only)       Balance Overall balance assessment: Needs assistance Sitting-balance support: Feet supported Sitting balance-Leahy Scale: Good     Standing balance support: During functional activity, Reliant on assistive device for balance Standing balance-Leahy Scale: Fair Standing balance comment: no LOB or buckling but continues with heavy lean on RW                            Communication Communication Communication: No apparent difficulties  Cognition Arousal: Alert Behavior During Therapy: WFL for tasks assessed/performed   PT - Cognitive impairments: No apparent impairments                         Following commands: Intact      Cueing Cueing Techniques: Verbal cues, Visual cues, Gestural cues, Tactile cues  Exercises  General Comments        Pertinent Vitals/Pain Pain Assessment Pain Assessment: 0-10 Pain Score: 9  Pain Location: back after gait Pain Descriptors / Indicators: Discomfort, Sore Pain Intervention(s): Premedicated before session, Monitored during session, Repositioned    Home Living                          Prior Function            PT Goals (current goals can now be found in the care plan section) Progress towards PT goals: Not progressing toward goals - comment    Frequency    Min 4X/week      PT Plan      Co-evaluation               AM-PAC PT 6 Clicks Mobility   Outcome Measure  Help needed turning from your back to your side while in a flat bed without using bedrails?: A Little Help needed moving from lying on your back to sitting on the side of a flat bed without using bedrails?: A Little Help needed moving to and from a bed to a chair (including a wheelchair)?: A Little Help needed standing up from a chair using your arms (e.g., wheelchair or bedside chair)?: A Little Help needed to walk in hospital room?: A Little Help needed climbing 3-5 steps with a railing? : A Little 6 Click Score: 18    End of Session Equipment Utilized During Treatment: Gait belt Activity Tolerance: Patient limited by fatigue Patient left: in chair;with call bell/phone within reach;with chair alarm set Nurse Communication: Mobility status PT Visit Diagnosis: Other abnormalities of gait and mobility (R26.89);Muscle weakness (generalized) (M62.81);Pain;Difficulty in walking, not elsewhere classified (R26.2) Pain - part of body:  (back)     Time: 9171-9153 PT Time Calculation (min) (ACUTE ONLY): 18 min  Charges:    $Gait Training: 8-22 mins PT General Charges $$ ACUTE PT VISIT: 1 Visit                    Lauraine Gills, PTA 09/18/24, 8:55 AM

## 2024-09-18 NOTE — Plan of Care (Signed)
  Problem: Skin Integrity: Goal: Risk for impaired skin integrity will decrease Outcome: Progressing   Problem: Nutritional: Goal: Maintenance of adequate nutrition will improve Outcome: Progressing   Problem: Health Behavior/Discharge Planning: Goal: Identification of resources available to assist in meeting health care needs will improve Outcome: Progressing

## 2024-09-19 ENCOUNTER — Other Ambulatory Visit: Payer: Self-pay

## 2024-09-19 LAB — IRON AND TIBC
Iron: 44 ug/dL — ABNORMAL LOW (ref 45–182)
Saturation Ratios: 16 % — ABNORMAL LOW (ref 17.9–39.5)
TIBC: 281 ug/dL (ref 250–450)
UIBC: 238 ug/dL

## 2024-09-19 LAB — VITAMIN D 25 HYDROXY (VIT D DEFICIENCY, FRACTURES): Vit D, 25-Hydroxy: 25.79 ng/mL — ABNORMAL LOW (ref 30–100)

## 2024-09-19 LAB — GLUCOSE, CAPILLARY
Glucose-Capillary: 197 mg/dL — ABNORMAL HIGH (ref 70–99)
Glucose-Capillary: 138 mg/dL — ABNORMAL HIGH (ref 70–99)

## 2024-09-19 LAB — VITAMIN B12: Vitamin B-12: 327 pg/mL (ref 180–914)

## 2024-09-19 LAB — FOLATE: Folate: 8.9 ng/mL (ref >5.9)

## 2024-09-19 MED ORDER — SODIUM BICARBONATE 650 MG PO TABS
650.0000 mg | ORAL_TABLET | Freq: Three times a day (TID) | ORAL | Status: DC
  Administered 2024-09-19: 650 mg via ORAL
  Filled 2024-09-19 (×2): qty 1

## 2024-09-19 MED ORDER — SENNA 8.6 MG PO TABS
1.0000 | ORAL_TABLET | Freq: Two times a day (BID) | ORAL | 0 refills | Status: AC | PRN
  Filled 2024-09-19: qty 30, 15d supply, fill #0

## 2024-09-19 MED ORDER — PREDNISONE 20 MG PO TABS
40.0000 mg | ORAL_TABLET | Freq: Every day | ORAL | 0 refills | Status: AC
  Filled 2024-09-19: qty 6, 3d supply, fill #0

## 2024-09-19 MED ORDER — METHOCARBAMOL 500 MG PO TABS
500.0000 mg | ORAL_TABLET | Freq: Four times a day (QID) | ORAL | 0 refills | Status: DC | PRN
  Filled 2024-09-19: qty 120, 30d supply, fill #0

## 2024-09-19 MED ORDER — ALBUTEROL SULFATE HFA 108 (90 BASE) MCG/ACT IN AERS
2.0000 | INHALATION_SPRAY | Freq: Four times a day (QID) | RESPIRATORY_TRACT | 2 refills | Status: AC | PRN
  Filled 2024-09-19: qty 6.7, 25d supply, fill #0

## 2024-09-19 MED ORDER — POLYETHYLENE GLYCOL 3350 17 GM/SCOOP PO POWD
17.0000 g | Freq: Every day | ORAL | 0 refills | Status: AC | PRN
  Filled 2024-09-19: qty 238, 14d supply, fill #0

## 2024-09-19 MED ORDER — OXYCODONE HCL 5 MG PO TABS
5.0000 mg | ORAL_TABLET | ORAL | 0 refills | Status: DC | PRN
  Filled 2024-09-19: qty 45, 8d supply, fill #0

## 2024-09-19 MED ORDER — ACETAMINOPHEN 500 MG PO TABS: 1000.0000 mg | ORAL_TABLET | Freq: Four times a day (QID) | ORAL | Status: DC

## 2024-09-19 NOTE — Progress Notes (Signed)
 Patient discharged to home. HH to follow,. Patient alert and oriented x4. Surgical incision dressing change to honeycomb by surgeon before discharge. IV removed with catheter and tip intact. Gauze and tape to site. Meds to bed delivered in addition to meds he had previously. Discharge instructions instructions given to patient with understanding. Staff to transport patient to discharge lounge until ride arrives.

## 2024-09-19 NOTE — Progress Notes (Signed)
 [facility] Progress Note  Patient: Johnny Gonzales    FMW:[id number]  DOA: 09/13/2024     Date of Service: the patient was seen and examined on 09/19/2024  No chief complaint on file.  Brief hospital course: Johnny Gonzales is a 63 y.o. male with past medical history of HLD, T2DM, tobacco use disorder, COPD and DDD with severe multilevel stenosis who was admitted for thoracic myelopathy to undergo surgical intervention. TRH is asked to evaluate on POD 5 for generalized weakness. Pt states he just feels winded. Denies significant coughing, URI symptoms, fevers or chills. States he has been having numbness in bilateral legs that predates his surgery. It is still present and unchanged.    Assessment and Plan:  # DOE possible due to mild COPD exacerbation Pt with shortness of breath with exertion and notable increased during consult on 12/8. He has hx of COPD and has not been using his inhalers. Lung sounds were diminished with mild wheeze, possible mild COPD exacerbation  It was discussed with primary team to start on steroids, DuoNeb and inhalers. Considered other causes including ACS vs PE vs PNA but unlikely given no chest pain, no tachycardia and and no fevers or chills.   Patient can be discharged on prednisone  40 mg p.o. daily for 3 days and albuterol  inhaler as needed Patient seems stable, can be discharged by primary team.  Recommend to follow with pulmonary for PFTs and PCP for further management as an outpatient   # Orthostatic hypotension: likely in setting of decreased intake as reported by the patient. S/P IVF.   Check orthostatics again today before discharge  # HLD: continue home crestor  5 mg every day.    # Tobacco use disorder: counseled on complete cessation.    # T2DM: holding home metformin  and on SSI.  Resume home medications on discharge   # DDD: continue multimodal pain regimen including duloxetine . Acute pain regimen ordered by NSG.    Body mass index is 32.55  kg/m.  Interventions:  Diet: Carb modified diet DVT Prophylaxis: Subcutaneous Lovenox    Advance goals of care discussion: Full code  Family Communication: family was NOT present at bedside, at the time of interview.  The pt provided permission to discuss medical plan with the family. Opportunity was given to ask question and all questions were answered satisfactorily.   Disposition:  Pt is from home, admitted with back pain, stable thoracic spine surgery, TRH was consulted for dyspnea on exertion for possible mild COPD exacerbation Currently patient is stable, can be discharged home if cleared by primary team.   Subjective: No significant events overnight, patient was resting comfortably in the bed, denied any shortness of breath, no chest pain or palpitation.  Patient stated that he is ready to go home.  Physical Exam: General: NAD, lying comfortably Appear in no distress, affect appropriate Eyes: PERRLA ENT: Oral Mucosa Clear, moist  Neck: no JVD,  Cardiovascular: S1 and S2 Present, no Murmur,  Respiratory: good respiratory effort, Bilateral Air entry equal and Decreased, no Crackles, no wheezes Abdomen: Bowel Sound present, Soft and no tenderness,  Skin: no rashes Extremities: no Pedal edema, no calf tenderness Neurologic: without any new focal findings Gait not checked due to patient safety concerns  Vitals:   09/18/24 2035 09/18/24 2200 09/19/24 0508 09/19/24 0749  BP:  103/62 114/66 112/65  Pulse:  79 70 74  Resp:   17 16  Temp:  98 F (36.7 C) 98.4 F (36.9 C)   TempSrc:  SpO2: 98% 98% 99% 96%  Weight:      Height:        Intake/Output Summary (Last 24 hours) at 09/19/2024 1034 Last data filed at 09/19/2024 1026 Gross per 24 hour  Intake 240 ml  Output 300 ml  Net -60 ml   Filed Weights   09/13/24 0953  Weight: 108.9 kg    Data Reviewed: I have personally reviewed and interpreted daily labs, tele strips, imagings as discussed above. I reviewed all  nursing notes, pharmacy notes, vitals, pertinent old records I have discussed plan of care as described above with RN and patient/family.  CBC: Recent Labs  Lab 09/17/24 1233 09/18/24 1407  WBC 9.2 10.4  NEUTROABS 5.8 6.1  HGB 13.4 12.9*  HCT 39.9 38.1*  MCV 79.3* 79.4*  PLT 224 300   Basic Metabolic Panel: Recent Labs  Lab 09/14/24 0329 09/17/24 1233 09/18/24 1407  NA  --  137 136  K  --  4.3 4.0  CL  --  101 101  CO2  --  20* 20*  GLUCOSE  --  113* 110*  BUN  --  13 18  CREATININE 1.03 0.77 0.78  CALCIUM   --  9.5 9.2    Studies: No results found.  Scheduled Meds:  acetaminophen   1,000 mg Oral Q6H   budesonide  (PULMICORT ) nebulizer solution  0.5 mg Nebulization BID   celecoxib   200 mg Oral BID   Chlorhexidine  Gluconate Cloth  6 each Topical Daily   docusate sodium   100 mg Oral BID   DULoxetine   40 mg Oral BH-q7a   enoxaparin  (LOVENOX ) injection  40 mg Subcutaneous Q24H   insulin  aspart  0-15 Units Subcutaneous TID WC   insulin  aspart  0-5 Units Subcutaneous QHS   ipratropium-albuterol   3 mL Nebulization BID   metFORMIN   500 mg Oral Q breakfast   predniSONE   40 mg Oral Q breakfast   senna  1 tablet Oral BID   sodium bicarbonate   650 mg Oral TID   sodium chloride  flush  3 mL Intravenous Q12H   SMOG  960 mL Rectal Once   Continuous Infusions: PRN Meds: acetaminophen  **OR** acetaminophen , calcium  carbonate, chlorproMAZINE , menthol  **OR** phenol, methocarbamol  **OR** methocarbamol  (ROBAXIN ) injection, ondansetron  **OR** ondansetron  (ZOFRAN ) IV, mouth rinse, oxyCODONE , oxyCODONE , polyethylene glycol, sodium chloride  flush, sorbitol   Time spent: 35 minutes  Author: ELVAN SOR. MD [facility] 09/19/2024 10:34 AM  To reach On-call, see care teams to locate the attending and reach out to them via [email or web address]. If 7PM-7AM, please contact night-coverage If you still have difficulty reaching the attending provider, please page the [facility] (Director on Call) for  [facility] on amion for assistance.

## 2024-09-19 NOTE — Plan of Care (Signed)
   Problem: Education: Goal: Knowledge of General Education information will improve Description Including pain rating scale, medication(s)/side effects and non-pharmacologic comfort measures Outcome: Progressing

## 2024-09-19 NOTE — TOC CM/SW Note (Signed)
 Patient is not able to walk the distance required to go the bathroom, or he/she is unable to safely negotiate stairs required to access the bathroom.  A 3in1 BSC will alleviate this problem

## 2024-09-19 NOTE — TOC Initial Note (Signed)
 Transition of Care [facility]) - Initial/Assessment Note    Patient Details  Name: Johnny Gonzales MRN: [id number] Date of Birth: [birth year]  Transition of [facility]) CM/SW Contact:    Johnny JONETTA Hamilton, RN Phone Number: 09/19/2024, 2:15 PM  Clinical Narrative:                  Met with patient, introduced self and explained role. Patient verbalizes he lives with roommates in an apartment on the lower floor, no steps to get to home. Patient verbalizes he is independent in all things and can drive however doesn't currently have a vehicle. Discussed with patient therapy recommends HH PT/OT, patient verbalizes agreement to this however he states he does not have a preference of agency.  Patient verbalizes he is ready to go home and his brother Johnny Gonzales will be transporting him once medically ready for discharge.   HH referrals sent in HUB, received multiple declines as patient insurance not accepted or out of network. [facility] considering however, may be a co-pay involved, they won't know until they run the insurance.   Spoke with patient regarding BSC and RW. Patient's insurance is out of network for [employer], patient verbalizes that he doesn't need the [facility], the bathroom is just down the hall and he has a walker at home. Discussed with patient the possibility of co-pay with [facility], patient verbalized acceptance of [facility] and will await contact from them to discuss co-pay amount.      Expected Discharge Plan: Home w Home Health Services Barriers to Discharge: Barriers Resolved   Patient Goals and CMS Choice     Choice offered to / list presented to : Patient      Expected Discharge Plan and Services   Discharge Planning Services: CM Consult Post Acute Care Choice: Home Health Living arrangements for the past 2 months: Apartment (lower level, pt verbalized no steps) Expected Discharge Date: 09/19/24                                    Prior Living Arrangements/Services Living  arrangements for the past 2 months: Apartment (lower level, pt verbalized no steps) Lives with:: Roommate Patient language and need for interpreter reviewed:: Yes Do you feel safe going back to the place where you live?: Yes      Need for Family Participation in Patient Care: No (Comment) Care giver support system in place?: Yes (comment)   Criminal Activity/Legal Involvement Pertinent to Current Situation/Hospitalization: No - Comment as needed  Activities of Daily Living   ADL Screening (condition at time of admission) Independently performs ADLs?: Yes (appropriate for developmental age) Is the patient deaf or have difficulty hearing?: No Does the patient have difficulty seeing, even when wearing glasses/contacts?: No Does the patient have difficulty concentrating, remembering, or making decisions?: No  Permission Sought/Granted Permission sought to share information with : Case Manager, Oceanographer granted to share information with : Yes, Verbal Permission Granted              Emotional Assessment Appearance:: Appears stated age, Well-Groomed Attitude/Demeanor/Rapport: Engaged Affect (typically observed): Appropriate Orientation: : Oriented to Self, Oriented to Place, Oriented to  Time, Oriented to Situation Alcohol / Substance Use: Not Applicable Psych Involvement: No (comment)  Admission diagnosis:  Thoracic myelopathy [M47.14] Myelomalacia [facility]) [G95.89] Thoracic stenosis [M48.04] Patient Active Problem List   Diagnosis Date Noted   T2DM (type 2 diabetes mellitus) (  HCC) 09/18/2024   Thoracic myelopathy 09/13/2024   Thoracic stenosis 09/13/2024   Myelomalacia (HCC) 09/13/2024   SOB (shortness of breath) 09/04/2024   Tobacco use disorder 09/04/2024   Back pain 09/04/2024   Hyperlipidemia 09/04/2024   Perineal abscess 05/17/2015   PCP:  Kotturi, Vinay K, MD Pharmacy:   [facility] [phone] [address], [place] - [address]  [place] [address] Phone: [phone] Fax: [phone]  [facility] [address] [place] [address] Phone: [phone] Fax: [phone]     Social Drivers of Health (SDOH) Social History: SDOH Screenings   Food Insecurity: Patient Declined (09/14/2024)  Housing: Unknown (09/14/2024)  Transportation Needs: Patient Declined (09/14/2024)  Utilities: Patient Declined (09/14/2024)  Depression (PHQ2-9): Low Risk  (09/12/2024)  Recent Concern: Depression (PHQ2-9) - High Risk (07/20/2024)  Tobacco Use: High Risk (09/13/2024)   SDOH Interventions:     Readmission Risk Interventions     No data to display

## 2024-09-19 NOTE — Plan of Care (Signed)
  Problem: Metabolic: Goal: Ability to maintain appropriate glucose levels will improve Outcome: Progressing   Problem: Nutritional: Goal: Maintenance of adequate nutrition will improve Outcome: Progressing   Problem: Skin Integrity: Goal: Risk for impaired skin integrity will decrease Outcome: Progressing   Problem: Activity: Goal: Ability to avoid complications of mobility impairment will improve Outcome: Progressing   Problem: Safety: Goal: Ability to remain free from injury will improve Outcome: Progressing

## 2024-09-19 NOTE — Discharge Summary (Signed)
 Discharge Summary  Patient ID: Johnny Gonzales MRN: [id number] DOB/AGE: 63-Jun-1962 63 y.o.  Admit date: 09/13/2024 Discharge date: 09/19/2024  Admission Diagnoses: M47.14 Thoracic myelopathy, G95.89 Myelomalacia, M48.04 Thoracic stenosis  Discharge Diagnoses:  Principal Problem:   Thoracic myelopathy Active Problems:   Tobacco use disorder   Hyperlipidemia   Thoracic stenosis   Myelomalacia (HCC)   T2DM (type 2 diabetes mellitus) (HCC)   Discharged Condition: good  Hospital Course:  Johnny Gonzales is a 63 y.o presenting with thoracic stenosis and myelopathy status post left T10 and T11 transpedicular discectomy and T10 to T11 posterior spinal fusion.  His intraoperative course was uncomplicated.  He was admitted for therapy evaluation, pain control, and drain output monitoring.  He was initially doing very well on postop day 1 however he experienced uncontrolled pain resulting in additional hospitalization.  His postoperative course was also complicated by constipation which resolved with medications, and shortness of breath.  Internal medicine was consulted and he was diagnosed with a mild COPD exacerbation.  This improved significantly and he was cleared by IM and therapy for discharge home on POD 6. He was given Oxycodone , Robaxin , Arvis and Miralax  to take as needed as well as Prednisone  40 mg p.o. daily x 3 days and albuterol  inhaler for prn use per IM recommendations.   Consults: None  Significant Diagnostic Studies:     Latest Ref Rng & Units 09/18/2024    2:07 PM 09/17/2024   12:33 PM 12/28/2020    2:22 PM  CBC  WBC 4.0 - 10.5 K/uL 10.4  9.2  9.4   Hemoglobin 13.0 - 17.0 g/dL 87.0  86.5  85.7   Hematocrit 39.0 - 52.0 % 38.1  39.9  43.2   Platelets 150 - 400 K/uL 300  224  246       Latest Ref Rng & Units 09/18/2024    2:07 PM 09/17/2024   12:33 PM 09/14/2024    3:29 AM  BMP  Glucose 70 - 99 mg/dL 889  886    BUN 8 - 23 mg/dL 18  13    Creatinine 9.38 - 1.24 mg/dL 9.21  9.22   8.96   Sodium 135 - 145 mmol/L 136  137    Potassium 3.5 - 5.1 mmol/L 4.0  4.3    Chloride 98 - 111 mmol/L 101  101    CO2 22 - 32 mmol/L 20  20    Calcium  8.9 - 10.3 mg/dL 9.2  9.5     87/2/74 US  BLE  IMPRESSION: No evidence of femoropopliteal DVT or superficial thrombophlebitis within either lower extremity.     Electronically Signed   By: Thom Hall M.D.   On: 09/17/2024 12:33   09/17/24 chest XR IMPRESSION: 1. No acute cardiopulmonary process. 2. Faint interstitial accentuation, potentially smoking-related. 3. Thoracic spondylosis.   Electronically signed by: Ryan Salvage MD 09/17/2024 03:23 PM EST RP Workstation: [id number]    Treatments: surgery: as above. Please see separately dictated operative report for further details   Discharge Exam: Blood pressure 112/65, pulse 74, temperature 98.4 F (36.9 C), resp. rate 16, height 6' (1.829 m), weight 108.9 kg, SpO2 96%. AA Ox3 CNI   Strength:5/5 throughout bilateral lower extremities with exception of left hip flexion which is 4+ out of 5 Incision c/d/I with staples in place   Disposition: Discharge disposition: 06-Home-Health Care Svc       Discharge Instructions     Incentive spirometry RT   Complete by: As directed  Remove dressing in 48 hours   Complete by: As directed       Allergies as of 09/19/2024   No Known Allergies      Medication List     STOP taking these medications    BC HEADACHE POWDER PO   Breztri  Aerosphere 160-9-4.8 MCG/ACT Aero inhaler Generic drug: budesonide -glycopyrrolate -formoterol   nicotine  polacrilex 4 MG gum Commonly known as: Nicorette    rosuvastatin  5 MG tablet Commonly known as: Crestor    sennosides-docusate sodium  8.6-50 MG tablet Commonly known as: SENOKOT-S       TAKE these medications    acetaminophen  500 MG tablet Commonly known as: TYLENOL  Take 2 tablets (1,000 mg total) by mouth every 6 (six) hours.   albuterol  108 (90 Base) MCG/ACT  inhaler Commonly known as: VENTOLIN  HFA Inhale 2 puffs into the lungs every 6 (six) hours as needed for wheezing or shortness of breath. What changed: Another medication with the same name was added. Make sure you understand how and when to take each.   albuterol  108 (90 Base) MCG/ACT inhaler Commonly known as: VENTOLIN  HFA Inhale 2 puffs into the lungs every 6 (six) hours as needed for wheezing or shortness of breath. What changed: You were already taking a medication with the same name, and this prescription was added. Make sure you understand how and when to take each.   celecoxib  200 MG capsule Commonly known as: CELEBREX  Take 1 capsule (200 mg total) by mouth 2 (two) times daily.   DULoxetine  HCl 40 MG Cpep Take 1 capsule (40 mg total) by mouth daily. What changed: when to take this   metFORMIN  500 MG tablet Commonly known as: GLUCOPHAGE  Take 1 tablet (500 mg total) by mouth 2 (two) times daily with a meal. What changed: when to take this   methocarbamol  500 MG tablet Commonly known as: ROBAXIN  Take 1 tablet (500 mg total) by mouth every 6 (six) hours as needed for muscle spasms. What changed:  when to take this reasons to take this   oxyCODONE  5 MG immediate release tablet Commonly known as: Oxy IR/ROXICODONE  Take 1-2 tablets (5-10 mg total) by mouth every 4 (four) hours as needed for moderate pain (pain score 4-6) or severe pain (pain score 7-10).   polyethylene glycol powder 17 GM/SCOOP powder Commonly known as: GLYCOLAX /MIRALAX  Take 17 g by mouth daily as needed for moderate constipation. Dissolve 1 capful (17g) in 4-8 ounces of liquid and take by mouth daily.   predniSONE  20 MG tablet Commonly known as: DELTASONE  Take 2 tablets (40 mg total) by mouth daily with breakfast for 3 days. Start taking on: September 20, 2024   senna 8.6 MG Tabs tablet Commonly known as: SENOKOT Take 1 tablet (8.6 mg total) by mouth 2 (two) times daily as needed for mild constipation.                Durable Medical Equipment  (From admission, onward)           Start     Ordered   09/18/24 1553  For home use only DME Bedside commode  Once       Question:  Patient needs a bedside commode to treat with the following condition  Answer:  Thoracic myelopathy   09/18/24 1552             Signed: Edsel Jama Goods 09/19/2024, 1:30 PM

## 2024-09-19 NOTE — Progress Notes (Signed)
 Patient wound vac dressing had to be replaced per manager. No drainage, odor to old dressing. Staples in tact Incision clean and dry. Tolerated change well. Attending and surgeon notified.

## 2024-09-19 NOTE — Progress Notes (Cosign Needed Addendum)
 Physical Therapy Treatment Patient Details Name: Johnny Gonzales MRN: [id number] DOB: Dec 01, 1960 Today's Date: 09/19/2024   History of Present Illness [name] is here today with a chief complaint of  difficulty walking and numbness in both legs. He is s/p TLIF. No brace needed.    PT Comments  Pt motivated for session and wanting to return home.  I want to get the hell out of here.  He is able to get to EOB with bed features.  Stands from elevated bed and is able to walk 60' with RW and cga x 1 with heavy lean on RW.  He does request to sit in recliner due to fatigue.  He is taken to gym and is able to walk up/down 2 steps with no buckling or LOB.  He remains up in chair after session.   No dizziness reported.  Sats and HR WFL.  Pt stated he feels comfortable with discharge home.  Brothers in and do voice some concerns but pt confirms he will be good and wants to discharge home.  Discussed with team.     If plan is discharge home, recommend the following: A little help with walking and/or transfers;A little help with bathing/dressing/bathroom;Assist for transportation;Help with stairs or ramp for entrance;Assistance with cooking/housework   Can travel by Training And Development Officer (2 wheels);BSC/3in1    Recommendations for Other Services       Precautions / Restrictions Precautions Precautions: Fall;Back Recall of Precautions/Restrictions: Intact Restrictions Weight Bearing Restrictions Per Provider Order: No     Mobility  Bed Mobility   Bed Mobility: Supine to Sit   Sidelying to sit: HOB elevated, Used rails Supine to sit: Supervision       Patient Response: Cooperative, Anxious  Transfers Overall transfer level: Needs assistance Equipment used: Rolling walker (2 wheels) Transfers: Sit to/from Stand Sit to Stand: Contact guard assist           General transfer comment: cues for hand placements as he continues to pull  up on walker    Ambulation/Gait Ambulation/Gait assistance: Contact guard assist Gait Distance (Feet): 60 Feet Assistive device: Rolling walker (2 wheels) Gait Pattern/deviations: Step-through pattern, Decreased step length - right, Decreased step length - left, Decreased stride length Gait velocity: decreased     General Gait Details: heavy lean on RW but overall improved gait   Stairs Stairs: Yes Stairs assistance: Contact guard assist Stair Management: One rail Right, Backwards, Forwards Number of Stairs: 2 General stair comments: 2 forwards then backed down x 2 steps with right rail support   Wheelchair Mobility     Tilt Bed Tilt Bed Patient Response: Cooperative, Anxious  Modified Rankin (Stroke Patients Only)       Balance Overall balance assessment: Needs assistance Sitting-balance support: Feet supported Sitting balance-Leahy Scale: Good     Standing balance support: During functional activity, Reliant on assistive device for balance Standing balance-Leahy Scale: Fair Standing balance comment: no LOB or buckling but continues with heavy lean on RW                            Communication Communication Communication: No apparent difficulties  Cognition Arousal: Alert Behavior During Therapy: WFL for tasks assessed/performed, Anxious   PT - Cognitive impairments: No apparent impairments, Safety/Judgement  PT - Cognition Comments: anxious for discharge Following commands: Intact      Cueing Cueing Techniques: Verbal cues, Visual cues, Gestural cues, Tactile cues  Exercises      General Comments        Pertinent Vitals/Pain Pain Assessment Pain Assessment: Faces Faces Pain Scale: Hurts little more Pain Location: back Pain Descriptors / Indicators: Discomfort, Sore Pain Intervention(s): Limited activity within patient's tolerance, Monitored during session, Premedicated before session, Repositioned     Home Living                          Prior Function            PT Goals (current goals can now be found in the care plan section) Progress towards PT goals: Progressing toward goals    Frequency    Min 4X/week      PT Plan      Co-evaluation              AM-PAC PT 6 Clicks Mobility   Outcome Measure  Help needed turning from your back to your side while in a flat bed without using bedrails?: A Little Help needed moving from lying on your back to sitting on the side of a flat bed without using bedrails?: A Little Help needed moving to and from a bed to a chair (including a wheelchair)?: A Little Help needed standing up from a chair using your arms (e.g., wheelchair or bedside chair)?: A Little Help needed to walk in hospital room?: A Little Help needed climbing 3-5 steps with a railing? : A Little 6 Click Score: 18    End of Session Equipment Utilized During Treatment: Gait belt Activity Tolerance: Patient tolerated treatment well Patient left: in chair;with call bell/phone within reach;with family/visitor present Nurse Communication: Mobility status PT Visit Diagnosis: Other abnormalities of gait and mobility (R26.89);Muscle weakness (generalized) (M62.81);Pain;Difficulty in walking, not elsewhere classified (R26.2)     Time: 1135-1150 PT Time Calculation (min) (ACUTE ONLY): 15 min  Charges:    $Gait Training: 8-22 mins PT General Charges $$ ACUTE PT VISIT: 1 Visit                    Lauraine Gills, PTA 09/19/24, 12:58 PM

## 2024-09-19 NOTE — Progress Notes (Signed)
 Neurosurgery Progress Note  History: Johnny Gonzales is here for thoracic myelopathy  POD6: Pt experienced some ongoing dizziness and tachycardia when working with therapy yesterday. IM was consulted for assistance. Pt had BM yesterday evening after PRNs  POD5: Pt still complaining of swelling in RLE. States his SOB and abdominal pain have improved.  POD4: Having significant abdominal pain. Has not had BM POD3: Doing well.   POD2: Had significant pain overnight POD 1: He is doing well.  His legs are feeling stronger.  He still has some altered sensation in his feet.  Physical Exam: Vitals:   09/18/24 2200 09/19/24 0508  BP: 103/62 114/66  Pulse: 79 70  Resp:  17  Temp: 98 F (36.7 C) 98.4 F (36.9 C)  SpO2: 98% 99%    AA Ox3 CNI  Strength:5/5 throughout bilateral lower extremities with exception of left hip flexion which is 4+ out of 5 Sensation is symmetric bilaterally  Incision with wound back in place. Seroma seems to be improving  CXR 12/7 IMPRESSION: 1. No acute cardiopulmonary process. 2. Faint interstitial accentuation, potentially smoking-related. 3. Thoracic spondylosis.   Electronically signed by: Ryan Salvage MD 09/17/2024 03:23 PM EST RP Workstation: [id number]      Latest Ref Rng & Units 09/18/2024    2:07 PM 09/17/2024   12:33 PM 12/28/2020    2:22 PM  CBC  WBC 4.0 - 10.5 K/uL 10.4  9.2  9.4   Hemoglobin 13.0 - 17.0 g/dL 87.0  86.5  85.7   Hematocrit 39.0 - 52.0 % 38.1  39.9  43.2   Platelets 150 - 400 K/uL 300  224  246       Latest Ref Rng & Units 09/18/2024    2:07 PM 09/17/2024   12:33 PM 09/14/2024    3:29 AM  BMP  Glucose 70 - 99 mg/dL 889  886    BUN 8 - 23 mg/dL 18  13    Creatinine 9.38 - 1.24 mg/dL 9.21  9.22  8.96   Sodium 135 - 145 mmol/L 136  137    Potassium 3.5 - 5.1 mmol/L 4.0  4.3    Chloride 98 - 111 mmol/L 101  101    CO2 22 - 32 mmol/L 20  20    Calcium  8.9 - 10.3 mg/dL 9.2  9.5      Assessment/Plan:  Alm Pray is  doing well after transpedicular discectomy for thoracic stenosis and myelopathy.   - mobilize - pain control - DVT prophylaxis - PTOT - Pt had BM On 12/8 - IM consulted for dizziness. Suspcted mild COPD exacerbation and started duonebs, pulmicort  and prednisone . - Pt doing better this morning. If cleared by therapy and IM will consider d/c this afternoon. Neurosurgery will remove wound vac and place new dressing prior to discharge  Edsel Goods PA-C Department of Neurosurgery

## 2024-09-20 ENCOUNTER — Telehealth: Payer: Self-pay | Admitting: Family Medicine

## 2024-09-20 NOTE — Telephone Encounter (Signed)
 Copied from CRM #[id number]. Topic: General - Other >> Sep 20, 2024  4:32 PM Wess RAMAN wrote: Reason for CRM: Dorothe from [facility] would like to let Dr. Sol know that they will be providing personal care services for patient  Callback #: [phone]

## 2024-09-20 NOTE — Telephone Encounter (Signed)
 Please review

## 2024-09-21 NOTE — Telephone Encounter (Signed)
 Noted

## 2024-09-26 ENCOUNTER — Ambulatory Visit: Admitting: Physician Assistant

## 2024-09-26 ENCOUNTER — Encounter: Payer: Self-pay | Admitting: Physician Assistant

## 2024-09-26 VITALS — BP 116/82 | Temp 97.8°F | Ht 72.0 in | Wt 240.0 lb

## 2024-09-26 DIAGNOSIS — Z09 Encounter for follow-up examination after completed treatment for conditions other than malignant neoplasm: Secondary | ICD-10-CM

## 2024-09-26 DIAGNOSIS — R062 Wheezing: Secondary | ICD-10-CM

## 2024-09-26 DIAGNOSIS — Z87891 Personal history of nicotine dependence: Secondary | ICD-10-CM

## 2024-09-26 DIAGNOSIS — M4714 Other spondylosis with myelopathy, thoracic region: Secondary | ICD-10-CM

## 2024-09-26 MED ORDER — MAGNESIUM CITRATE PO SOLN: 1.0000 | Freq: Once | ORAL | 2 refills | Status: AC

## 2024-09-26 MED ORDER — ACETAMINOPHEN 500 MG PO TABS: 1000.0000 mg | ORAL_TABLET | Freq: Three times a day (TID) | ORAL | 1 refills | Status: AC | PRN

## 2024-09-26 MED ORDER — OXYCODONE HCL 5 MG PO TABS: 5.0000 mg | ORAL_TABLET | ORAL | 0 refills | Status: AC | PRN

## 2024-09-26 MED ORDER — METHOCARBAMOL 500 MG PO TABS: 500.0000 mg | ORAL_TABLET | Freq: Four times a day (QID) | ORAL | 0 refills | Status: AC | PRN

## 2024-09-26 NOTE — Progress Notes (Signed)
" ° °  REFERRING PHYSICIAN:  Kotturi, Vinay K, Md [address], [address] [place],  [place] [address]  DOS: 09/13/24, Open left T10-11 transpedicular discectomy and fusion, posterior thoracic fusion  HISTORY OF PRESENT ILLNESS: Johnny Gonzales is approximately 2 weeks status post Open left T10-11 transpedicular discectomy and fusion, posterior thoracic fusion. he is doing well.  He still has some numbness in his right foot, some minimal numbness on the left.  His back pain has improved.  He takes a muscle relaxer and oxycodone  for relief at this time.  He has been very constipated secondary to the medication.    PHYSICAL EXAMINATION:  General: Patient is well developed, well nourished, calm, collected, and in no apparent distress.   NEUROLOGICAL:  General: In no acute distress.   Awake, alert, oriented to person, place, and time.  Pupils equal round and reactive to light.  Facial tone is symmetric.     Strength:            Side Iliopsoas Quads Hamstring PF DF EHL  R 5 5 5 5 5 5   L 4+ 5 5 5 5 5    Incision c/d/i   ROS (Neurologic):  Negative except as noted above  IMAGING: No interval imaging  ASSESSMENT/PLAN:  Johnny Gonzales is doing well approximately 2 weeks after above surgery. he will follow up in one month for his 6 week post-op visit.  We did discuss postop constipation and protocol to try to help with this.  Refills were given.  Patient is concerned about possible COPD and I have placed a referral for him for pulmonology as a result.I have advised the patient to lift up to 10 pounds until 6 weeks after surgery, then increase up to 25 pounds until 12 weeks after surgery.  After 12 weeks post-op, the patient advised to increase activity as tolerated.  Advised to contact the office if any questions or concerns arise.  Lyle Decamp PA-C Department of neurosurgery    "

## 2024-10-08 NOTE — Progress Notes (Deleted)
 NO SHOW

## 2024-10-10 ENCOUNTER — Encounter: Payer: Self-pay | Admitting: Gastroenterology

## 2024-10-10 ENCOUNTER — Ambulatory Visit: Admitting: Cardiovascular Disease

## 2024-10-10 DIAGNOSIS — F17200 Nicotine dependence, unspecified, uncomplicated: Secondary | ICD-10-CM

## 2024-10-10 DIAGNOSIS — E1169 Type 2 diabetes mellitus with other specified complication: Secondary | ICD-10-CM

## 2024-10-10 DIAGNOSIS — E782 Mixed hyperlipidemia: Secondary | ICD-10-CM

## 2024-10-10 DIAGNOSIS — R0602 Shortness of breath: Secondary | ICD-10-CM

## 2024-10-11 ENCOUNTER — Encounter: Payer: Self-pay | Admitting: Anesthesiology

## 2024-10-15 NOTE — Progress Notes (Deleted)
 Cardiology Office Note  Date:  10/15/2024   ID:  Johnny Gonzales, DOB 1961/09/30, MRN [id number]  PCP:  Kotturi, Vinay K, MD   No chief complaint on file.   HPI:  Johnny Gonzales is a 64 y.o. male with past medical history of: Past Medical History:  Diagnosis Date   Back pain    Cigarette smoker    Dyspnea    History of cocaine use    Hyperlipidemia    Lumbar spinal stenosis    Lumbar spondylosis    Pre-diabetes     Obesity Who presents by referral from Dr. Sol for shortness of breath  Preop for back surgery January 2026 On visit with primary care in August 18, 2024 shortness of breath when taking deep breaths which he attributes to his back pain Smokes 1/2 pack of cigarettes per week Winded easily  Concern for COPD  Scheduled for colonoscopy October 18, 2024   PMH:   has a past medical history of Back pain, Cigarette smoker, Dyspnea, History of cocaine use, Hyperlipidemia, Lumbar spinal stenosis, Lumbar spondylosis, and Pre-diabetes.   PSH:    Past Surgical History:  Procedure Laterality Date   APPLICATION OF INTRAOPERATIVE CT SCAN N/A 09/13/2024   Procedure: APPLICATION OF INTRAOPERATIVE CT SCAN;  Surgeon: Clois Fret, MD;  Location: [facility];  Service: Neurosurgery;  Laterality: N/A;   HAND SURGERY     INCISION AND DRAINAGE ABSCESS N/A 04/30/2015   Procedure: INCISION AND DRAINAGE ABSCESS/PERINEAL ABSCESS;  Surgeon: Rosina Riis, MD;  Location: [facility];  Service: Urology;  Laterality: N/A;   THORACIC DISCECTOMY Left 09/13/2024   Procedure: OPEN LEFT T10-11 TRANSPEDICULAR DISCECTOMY AND FUSION;  Surgeon: Clois Fret, MD;  Location: [facility];  Service: Neurosurgery;  Laterality: Left;  OPEN LEFT T10-11 TRANSPEDICULAR DISCECTOMY AND FUSION    Current Outpatient Medications  Medication Sig Dispense Refill   acetaminophen  (TYLENOL ) 500 MG tablet Take 2 tablets (1,000 mg total) by mouth every 8 (eight) hours as needed for moderate pain (pain score 4-6). 30  tablet 1   albuterol  (VENTOLIN  HFA) 108 (90 Base) MCG/ACT inhaler Inhale 2 puffs into the lungs every 6 (six) hours as needed for wheezing or shortness of breath. 8 g 2   albuterol  (VENTOLIN  HFA) 108 (90 Base) MCG/ACT inhaler Inhale 2 puffs into the lungs every 6 (six) hours as needed for wheezing or shortness of breath. 6.7 g 2   celecoxib  (CELEBREX ) 200 MG capsule Take 1 capsule (200 mg total) by mouth 2 (two) times daily. 60 capsule 0   DULoxetine  40 MG CPEP Take 1 capsule (40 mg total) by mouth daily. (Patient taking differently: Take 40 mg by mouth every morning.) 30 capsule 0   metFORMIN  (GLUCOPHAGE ) 500 MG tablet Take 1 tablet (500 mg total) by mouth 2 (two) times daily with a meal. (Patient taking differently: Take 500 mg by mouth daily.) 180 tablet 3   methocarbamol  (ROBAXIN ) 500 MG tablet Take 1 tablet (500 mg total) by mouth every 6 (six) hours as needed for muscle spasms. 120 tablet 0   oxyCODONE  (OXY IR/ROXICODONE ) 5 MG immediate release tablet Take 1-2 tablets (5-10 mg total) by mouth every 4 (four) hours as needed for moderate pain (pain score 4-6) or severe pain (pain score 7-10). 45 tablet 0   polyethylene glycol powder (GLYCOLAX /MIRALAX ) 17 GM/SCOOP powder Take 17 g by mouth daily as needed for moderate constipation. Dissolve 1 capful (17g) in 4-8 ounces of liquid and take by mouth daily. (Patient not taking: Reported on  10/10/2024) 238 g 0   senna (SENOKOT) 8.6 MG TABS tablet Take 1 tablet (8.6 mg total) by mouth 2 (two) times daily as needed for mild constipation. 30 tablet 0   No current facility-administered medications for this visit.     Allergies:   Patient has no known allergies.   Social History:  The patient  reports that he has been smoking cigarettes. He has never used smokeless tobacco. He reports that he does not currently use alcohol after a past usage of about 2.0 standard drinks of alcohol per week. He reports that he does not currently use drugs after having used  the following drugs: Cocaine.   Family History:   family history includes Heart attack in his brother and mother; Stroke in his father.    Review of Systems: ROS   PHYSICAL EXAM: VS:  There were no vitals taken for this visit. , BMI There is no height or weight on file to calculate BMI. GEN: Well nourished, well developed, in no acute distress HEENT: normal Neck: no JVD, carotid bruits, or masses Cardiac: RRR; no murmurs, rubs, or gallops,no edema  Respiratory:  clear to auscultation bilaterally, normal work of breathing GI: soft, nontender, nondistended, + BS MS: no deformity or atrophy Skin: warm and dry, no rash Neuro:  Strength and sensation are intact Psych: euthymic mood, full affect    Recent Labs: 09/18/2024: ALT 13; BUN 18; Creatinine, Ser 0.78; Hemoglobin 12.9; Platelets 300; Potassium 4.0; Sodium 136    Lipid Panel Lab Results  Component Value Date   CHOL 211 (H) 07/20/2024   HDL 34 (L) 07/20/2024   LDLCALC 154 (H) 07/20/2024   TRIG 126 07/20/2024      Wt Readings from Last 3 Encounters:  09/26/24 240 lb (108.9 kg)  09/13/24 240 lb (108.9 kg)  09/12/24 240 lb (108.9 kg)       ASSESSMENT AND PLAN:  Problem List Items Addressed This Visit   None    Disposition:   F/U  12 months   Total encounter time more than 30 minutes  Greater than 50% was spent in counseling and coordination of care with the patient    Signed, Velinda Lunger, M.D., Ph.D. [facility] [place], [place] [phone]

## 2024-10-16 ENCOUNTER — Other Ambulatory Visit: Payer: Self-pay

## 2024-10-16 DIAGNOSIS — M4714 Other spondylosis with myelopathy, thoracic region: Secondary | ICD-10-CM

## 2024-10-17 ENCOUNTER — Ambulatory Visit: Admitting: Cardiovascular Disease

## 2024-10-17 DIAGNOSIS — E782 Mixed hyperlipidemia: Secondary | ICD-10-CM

## 2024-10-17 DIAGNOSIS — R0602 Shortness of breath: Secondary | ICD-10-CM

## 2024-10-17 DIAGNOSIS — E1169 Type 2 diabetes mellitus with other specified complication: Secondary | ICD-10-CM

## 2024-10-17 DIAGNOSIS — F17200 Nicotine dependence, unspecified, uncomplicated: Secondary | ICD-10-CM

## 2024-10-18 ENCOUNTER — Encounter: Admission: RE | Payer: Self-pay | Source: Home / Self Care

## 2024-10-18 ENCOUNTER — Ambulatory Visit: Admission: RE | Admit: 2024-10-18 | Admitting: Gastroenterology

## 2024-10-18 SURGERY — COLONOSCOPY
Anesthesia: Choice

## 2024-10-22 NOTE — Progress Notes (Unsigned)
 Cardiology Office Note  Date:  10/22/2024   ID:  Johnny Gonzales, DOB 04-Jan-1961, MRN [id number]  PCP:  Kotturi, Vinay K, MD   No chief complaint on file.   HPI:  Johnny Gonzales is a 64 y.o. male with past medical history of: Past Medical History:  Diagnosis Date   Back pain    Cigarette smoker    Dyspnea    History of cocaine use    Hyperlipidemia    Lumbar spinal stenosis    Lumbar spondylosis    Pre-diabetes     Obesity Who presents by referral from Dr. Sol for shortness of breath  Preop for back surgery January 2026 On visit with primary care in August 18, 2024 shortness of breath when taking deep breaths which he attributes to his back pain Smokes 1/2 pack of cigarettes per week Winded easily  Concern for COPD  Scheduled for colonoscopy October 18, 2024   PMH:   has a past medical history of Back pain, Cigarette smoker, Dyspnea, History of cocaine use, Hyperlipidemia, Lumbar spinal stenosis, Lumbar spondylosis, and Pre-diabetes.   PSH:    Past Surgical History:  Procedure Laterality Date   APPLICATION OF INTRAOPERATIVE CT SCAN N/A 09/13/2024   Procedure: APPLICATION OF INTRAOPERATIVE CT SCAN;  Surgeon: Clois Fret, MD;  Location: [facility];  Service: Neurosurgery;  Laterality: N/A;   HAND SURGERY     INCISION AND DRAINAGE ABSCESS N/A 04/30/2015   Procedure: INCISION AND DRAINAGE ABSCESS/PERINEAL ABSCESS;  Surgeon: Rosina Riis, MD;  Location: [facility];  Service: Urology;  Laterality: N/A;   THORACIC DISCECTOMY Left 09/13/2024   Procedure: OPEN LEFT T10-11 TRANSPEDICULAR DISCECTOMY AND FUSION;  Surgeon: Clois Fret, MD;  Location: [facility];  Service: Neurosurgery;  Laterality: Left;  OPEN LEFT T10-11 TRANSPEDICULAR DISCECTOMY AND FUSION    Current Outpatient Medications  Medication Sig Dispense Refill   acetaminophen  (TYLENOL ) 500 MG tablet Take 2 tablets (1,000 mg total) by mouth every 8 (eight) hours as needed for moderate pain (pain score 4-6). 30  tablet 1   albuterol  (VENTOLIN  HFA) 108 (90 Base) MCG/ACT inhaler Inhale 2 puffs into the lungs every 6 (six) hours as needed for wheezing or shortness of breath. 8 g 2   albuterol  (VENTOLIN  HFA) 108 (90 Base) MCG/ACT inhaler Inhale 2 puffs into the lungs every 6 (six) hours as needed for wheezing or shortness of breath. 6.7 g 2   celecoxib  (CELEBREX ) 200 MG capsule Take 1 capsule (200 mg total) by mouth 2 (two) times daily. 60 capsule 0   DULoxetine  40 MG CPEP Take 1 capsule (40 mg total) by mouth daily. (Patient taking differently: Take 40 mg by mouth every morning.) 30 capsule 0   metFORMIN  (GLUCOPHAGE ) 500 MG tablet Take 1 tablet (500 mg total) by mouth 2 (two) times daily with a meal. (Patient taking differently: Take 500 mg by mouth daily.) 180 tablet 3   methocarbamol  (ROBAXIN ) 500 MG tablet Take 1 tablet (500 mg total) by mouth every 6 (six) hours as needed for muscle spasms. 120 tablet 0   oxyCODONE  (OXY IR/ROXICODONE ) 5 MG immediate release tablet Take 1-2 tablets (5-10 mg total) by mouth every 4 (four) hours as needed for moderate pain (pain score 4-6) or severe pain (pain score 7-10). 45 tablet 0   polyethylene glycol powder (GLYCOLAX /MIRALAX ) 17 GM/SCOOP powder Take 17 g by mouth daily as needed for moderate constipation. Dissolve 1 capful (17g) in 4-8 ounces of liquid and take by mouth daily. (Patient not taking: Reported on  10/10/2024) 238 g 0   senna (SENOKOT) 8.6 MG TABS tablet Take 1 tablet (8.6 mg total) by mouth 2 (two) times daily as needed for mild constipation. 30 tablet 0   No current facility-administered medications for this visit.     Allergies:   Patient has no known allergies.   Social History:  The patient  reports that he has been smoking cigarettes. He has never used smokeless tobacco. He reports that he does not currently use alcohol after a past usage of about 2.0 standard drinks of alcohol per week. He reports that he does not currently use drugs after having used  the following drugs: Cocaine.   Family History:   family history includes Heart attack in his brother and mother; Stroke in his father.    Review of Systems: ROS   PHYSICAL EXAM: VS:  There were no vitals taken for this visit. , BMI There is no height or weight on file to calculate BMI. GEN: Well nourished, well developed, in no acute distress HEENT: normal Neck: no JVD, carotid bruits, or masses Cardiac: RRR; no murmurs, rubs, or gallops,no edema  Respiratory:  clear to auscultation bilaterally, normal work of breathing GI: soft, nontender, nondistended, + BS MS: no deformity or atrophy Skin: warm and dry, no rash Neuro:  Strength and sensation are intact Psych: euthymic mood, full affect    Recent Labs: 09/18/2024: ALT 13; BUN 18; Creatinine, Ser 0.78; Hemoglobin 12.9; Platelets 300; Potassium 4.0; Sodium 136    Lipid Panel Lab Results  Component Value Date   CHOL 211 (H) 07/20/2024   HDL 34 (L) 07/20/2024   LDLCALC 154 (H) 07/20/2024   TRIG 126 07/20/2024      Wt Readings from Last 3 Encounters:  09/26/24 240 lb (108.9 kg)  09/13/24 240 lb (108.9 kg)  09/12/24 240 lb (108.9 kg)       ASSESSMENT AND PLAN:  Problem List Items Addressed This Visit   None    Disposition:   F/U  12 months   Total encounter time more than 30 minutes  Greater than 50% was spent in counseling and coordination of care with the patient    Signed, Velinda Lunger, M.D., Ph.D. [facility] [place], [place] [phone]

## 2024-10-23 ENCOUNTER — Ambulatory Visit: Admitting: Cardiovascular Disease

## 2024-10-23 DIAGNOSIS — F17200 Nicotine dependence, unspecified, uncomplicated: Secondary | ICD-10-CM

## 2024-10-23 DIAGNOSIS — E782 Mixed hyperlipidemia: Secondary | ICD-10-CM

## 2024-10-23 DIAGNOSIS — E1169 Type 2 diabetes mellitus with other specified complication: Secondary | ICD-10-CM

## 2024-10-23 DIAGNOSIS — R0602 Shortness of breath: Secondary | ICD-10-CM

## 2024-10-24 ENCOUNTER — Encounter: Admitting: Neurosurgery

## 2024-10-24 ENCOUNTER — Other Ambulatory Visit

## 2024-10-24 NOTE — Progress Notes (Signed)
 Cardiology Office Note  Date:  10/27/2024   ID:  Johnny Gonzales, DOB August 19, 1961, MRN [id number]  PCP:  Kotturi, Vinay K, MD   Chief Complaint  Patient presents with   New Patient (Initial Visit)    Ref by Dr. Sol for shortness of breath with over exertion.     HPI:  Johnny Gonzales is a 64 y.o. male with past medical history of: Past Medical History:  Diagnosis Date   Back pain    Cigarette smoker    Dyspnea    History of cocaine use    Hyperlipidemia    Lumbar spinal stenosis    Lumbar spondylosis    Pre-diabetes     Obesity Who presents by referral from Dr. Sol for shortness of breath  Referred from primary care back in August 18, 2024 Numerous no-shows and cancellations since that time back surgery  dec 2025 Still with numbness in the feet, comes and goes On visit with primary care in August 18, 2024 shortness of breath when taking deep breaths which he attributes to his back pain Smokes <1 pack of cigarettes per day Winded easily with walking Concern for COPD, smoking since age 43 Chronic bronchitis Has inhalers, albuterol   Scheduled for colonoscopy October 18, 2024  No prior cardiac studies available  Walks with cane, hx of falls, fell at home No chest pain, Shopping, legs get tired with shopping cart On discussion of his breathing, he reports it is stable, he has had stable mild chronic short of breath, no change over the past several years Continues to smoke approximately 1 pack/day  EKG personally reviewed by myself on todays visit EKG Interpretation Date/Time:  Friday October 27 2024 09:51:57 EST Ventricular Rate:  70 PR Interval:  180 QRS Duration:  108 QT Interval:  392 QTC Calculation: 423 R Axis:   19  Text Interpretation: Normal sinus rhythm Moderate voltage criteria for LVH, may be normal variant ( Sokolow-Lyon , Cornell product ) Nonspecific T wave abnormality When compared with ECG of 04-Sep-2024 10:23, Premature ventricular complexes are  no longer Present Confirmed by Perla Lye [phone]) on 10/27/2024 9:59:05 AM    PMH:   has a past medical history of Back pain, Cigarette smoker, Dyspnea, History of cocaine use, Hyperlipidemia, Lumbar spinal stenosis, Lumbar spondylosis, and Pre-diabetes.   PSH:    Past Surgical History:  Procedure Laterality Date   APPLICATION OF INTRAOPERATIVE CT SCAN N/A 09/13/2024   Procedure: APPLICATION OF INTRAOPERATIVE CT SCAN;  Surgeon: Clois Fret, MD;  Location: [facility];  Service: Neurosurgery;  Laterality: N/A;   HAND SURGERY     INCISION AND DRAINAGE ABSCESS N/A 04/30/2015   Procedure: INCISION AND DRAINAGE ABSCESS/PERINEAL ABSCESS;  Surgeon: Rosina Riis, MD;  Location: [facility];  Service: Urology;  Laterality: N/A;   THORACIC DISCECTOMY Left 09/13/2024   Procedure: OPEN LEFT T10-11 TRANSPEDICULAR DISCECTOMY AND FUSION;  Surgeon: Clois Fret, MD;  Location: [facility];  Service: Neurosurgery;  Laterality: Left;  OPEN LEFT T10-11 TRANSPEDICULAR DISCECTOMY AND FUSION    Current Outpatient Medications  Medication Sig Dispense Refill   acetaminophen  (TYLENOL ) 500 MG tablet Take 2 tablets (1,000 mg total) by mouth every 8 (eight) hours as needed for moderate pain (pain score 4-6). 30 tablet 1   celecoxib  (CELEBREX ) 200 MG capsule Take 1 capsule (200 mg total) by mouth 2 (two) times daily. 60 capsule 0   DULoxetine  40 MG CPEP Take 1 capsule (40 mg total) by mouth daily. 30 capsule 0   methocarbamol  (ROBAXIN )  500 MG tablet Take 1 tablet (500 mg total) by mouth every 6 (six) hours as needed for muscle spasms. 120 tablet 0   oxyCODONE  (OXY IR/ROXICODONE ) 5 MG immediate release tablet Take 1-2 tablets (5-10 mg total) by mouth every 4 (four) hours as needed for moderate pain (pain score 4-6) or severe pain (pain score 7-10). 45 tablet 0   polyethylene glycol powder (GLYCOLAX /MIRALAX ) 17 GM/SCOOP powder Take 17 g by mouth daily as needed for moderate constipation. Dissolve 1 capful (17g) in  4-8 ounces of liquid and take by mouth daily. 238 g 0   senna (SENOKOT) 8.6 MG TABS tablet Take 1 tablet (8.6 mg total) by mouth 2 (two) times daily as needed for mild constipation. 30 tablet 0   albuterol  (VENTOLIN  HFA) 108 (90 Base) MCG/ACT inhaler Inhale 2 puffs into the lungs every 6 (six) hours as needed for wheezing or shortness of breath. (Patient not taking: Reported on 10/27/2024) 8 g 2   albuterol  (VENTOLIN  HFA) 108 (90 Base) MCG/ACT inhaler Inhale 2 puffs into the lungs every 6 (six) hours as needed for wheezing or shortness of breath. (Patient not taking: Reported on 10/27/2024) 6.7 g 2   metFORMIN  (GLUCOPHAGE ) 500 MG tablet Take 1 tablet (500 mg total) by mouth 2 (two) times daily with a meal. (Patient not taking: Reported on 10/27/2024) 180 tablet 3   No current facility-administered medications for this visit.    Allergies:   Patient has no known allergies.   Social History:  The patient  reports that he has been smoking cigarettes. He started smoking about 40 years ago. He has a 80 pack-year smoking history. He has never used smokeless tobacco. He reports that he does not currently use alcohol after a past usage of about 2.0 standard drinks of alcohol per week. He reports that he does not currently use drugs after having used the following drugs: Cocaine.   Family History:   family history includes Heart attack in his brother, brother, and mother; Stroke in his father.    Review of Systems: Review of Systems  Constitutional: Negative.   HENT: Negative.    Respiratory: Negative.    Cardiovascular: Negative.   Gastrointestinal: Negative.   Musculoskeletal: Negative.   Neurological: Negative.   Psychiatric/Behavioral: Negative.    All other systems reviewed and are negative.  PHYSICAL EXAM: VS:  BP 110/72 (BP Location: Right Arm, Patient Position: Sitting, Cuff Size: Large)   Pulse 70   Ht 5' 9 (1.753 m)   Wt 240 lb (108.9 kg)   SpO2 98%   BMI 35.44 kg/m  , BMI Body mass  index is 35.44 kg/m. GEN: Well nourished, well developed, in no acute distress HEENT: normal Neck: no JVD, carotid bruits, or masses Cardiac: RRR; no murmurs, rubs, or gallops,no edema  Respiratory:  clear to auscultation bilaterally, normal work of breathing GI: soft, nontender, nondistended, + BS MS: no deformity or atrophy Skin: warm and dry, no rash Neuro:  Strength and sensation are intact Psych: euthymic mood, full affect  Recent Labs: 09/18/2024: ALT 13; BUN 18; Creatinine, Ser 0.78; Hemoglobin 12.9; Platelets 300; Potassium 4.0; Sodium 136    Lipid Panel Lab Results  Component Value Date   CHOL 211 (H) 07/20/2024   HDL 34 (L) 07/20/2024   LDLCALC 154 (H) 07/20/2024   TRIG 126 07/20/2024      Wt Readings from Last 3 Encounters:  10/27/24 240 lb (108.9 kg)  09/26/24 240 lb (108.9 kg)  09/13/24 240 lb (108.9  kg)    ASSESSMENT AND PLAN:  Problem List Items Addressed This Visit       Cardiology Problems   Hyperlipidemia     Other   Tobacco use disorder   T2DM (type 2 diabetes mellitus) (HCC)   SOB (shortness of breath) - Primary   Relevant Orders   EKG 12-Lead (Completed)   Shortness of breath Reports chronic mild shortness of breath, smoking 1 pack of cigarettes since he was age 15 No significant change in symptoms over the past several years Has chronic bronchitis, coughing up phlegm Uses albuterol  inhaler as needed No chest pain on exertion concerning for angina -Echo ordered for baseline study, rule out structural abnormality - Given lack of anginal symptoms will hold off on any ischemic workup - He would likely benefit on routine lung CT screening If covered by insurance  Hyperlipidemia Total cholesterol 211 Given underlying diabetes, would benefit from statin Goal LDL less than 100, current LDL 154 Will defer to primary care  Diabetes type 2 A1c 6.4 We have encouraged continued exercise, careful diet management in an effort to lose  weight. Managed by primary care  Smoker We have encouraged him to continue to work on weaning his cigarettes and smoking cessation. He will continue to work on this and does not want any assistance with chantix.    Seen in consultation for Dr. Sol and will be referred back to his office for ongoing care of the issues detailed above  Signed, Velinda Lunger, M.D., Ph.D. [facility] [place], [place] [phone]

## 2024-10-25 ENCOUNTER — Ambulatory Visit: Admitting: Student in an Organized Health Care Education/Training Program

## 2024-10-27 ENCOUNTER — Ambulatory Visit: Attending: Cardiovascular Disease | Admitting: Cardiovascular Disease

## 2024-10-27 ENCOUNTER — Encounter: Payer: Self-pay | Admitting: Cardiovascular Disease

## 2024-10-27 VITALS — BP 110/72 | HR 70 | Ht 69.0 in | Wt 240.0 lb

## 2024-10-27 DIAGNOSIS — E1169 Type 2 diabetes mellitus with other specified complication: Secondary | ICD-10-CM | POA: Diagnosis not present

## 2024-10-27 DIAGNOSIS — F17200 Nicotine dependence, unspecified, uncomplicated: Secondary | ICD-10-CM | POA: Diagnosis not present

## 2024-10-27 DIAGNOSIS — R0602 Shortness of breath: Secondary | ICD-10-CM | POA: Diagnosis not present

## 2024-10-27 DIAGNOSIS — E782 Mixed hyperlipidemia: Secondary | ICD-10-CM | POA: Diagnosis not present

## 2024-10-27 NOTE — Patient Instructions (Addendum)

## 2024-10-30 ENCOUNTER — Encounter: Payer: Self-pay | Admitting: Pulmonary Disease

## 2024-11-09 ENCOUNTER — Ambulatory Visit

## 2024-12-05 ENCOUNTER — Other Ambulatory Visit

## 2024-12-05 ENCOUNTER — Encounter
